# Patient Record
Sex: Female | Born: 1950 | Race: Black or African American | Hispanic: No | Marital: Single | Smoking: Current every day smoker
Health system: Southern US, Community
[De-identification: ages and names within clinical notes are randomized; demographics above are authoritative.]

## PROBLEM LIST (undated history)

## (undated) DIAGNOSIS — S82143A Displaced bicondylar fracture of unspecified tibia, initial encounter for closed fracture: Secondary | ICD-10-CM

## (undated) DIAGNOSIS — M25561 Pain in right knee: Secondary | ICD-10-CM

## (undated) DIAGNOSIS — E785 Hyperlipidemia, unspecified: Secondary | ICD-10-CM

## (undated) DIAGNOSIS — I10 Essential (primary) hypertension: Secondary | ICD-10-CM

## (undated) HISTORY — DX: Essential (primary) hypertension: I10

## (undated) HISTORY — PX: KNEE SURGERY: SHX244

## (undated) HISTORY — DX: Hyperlipidemia, unspecified: E78.5

## (undated) HISTORY — PX: ABDOMINAL HYSTERECTOMY: SHX81

---

## 2010-11-21 ENCOUNTER — Emergency Department (HOSPITAL_COMMUNITY)
Admission: EM | Admit: 2010-11-21 | Discharge: 2010-11-21 | Disposition: A | Discharge status: Home / Self Care | Payer: Managed Care, Other (non HMO) | Attending: Emergency Medicine | Admitting: Emergency Medicine

## 2010-11-21 ENCOUNTER — Emergency Department (HOSPITAL_COMMUNITY): Payer: Managed Care, Other (non HMO)

## 2010-11-21 DIAGNOSIS — S82109A Unspecified fracture of upper end of unspecified tibia, initial encounter for closed fracture: Secondary | ICD-10-CM | POA: Insufficient documentation

## 2010-11-22 ENCOUNTER — Inpatient Hospital Stay (HOSPITAL_COMMUNITY)
Admission: EM | Admit: 2010-11-22 | Discharge: 2010-11-24 | DRG: 563 | Disposition: A | Discharge status: Home Health | Payer: Managed Care, Other (non HMO) | Attending: Orthopedic Surgery | Admitting: Orthopedic Surgery

## 2010-11-22 ENCOUNTER — Inpatient Hospital Stay (HOSPITAL_COMMUNITY): Payer: Managed Care, Other (non HMO)

## 2010-11-22 ENCOUNTER — Emergency Department (HOSPITAL_COMMUNITY): Payer: Managed Care, Other (non HMO)

## 2010-11-22 DIAGNOSIS — F17200 Nicotine dependence, unspecified, uncomplicated: Secondary | ICD-10-CM | POA: Diagnosis present

## 2010-11-22 DIAGNOSIS — S82109A Unspecified fracture of upper end of unspecified tibia, initial encounter for closed fracture: Principal | ICD-10-CM | POA: Diagnosis present

## 2010-11-22 LAB — TYPE AND SCREEN
ABO/RH(D): O NEG
Antibody Screen: NEGATIVE

## 2010-11-22 LAB — COMPREHENSIVE METABOLIC PANEL
ALT: 12 U/L (ref 0–35)
AST: 12 U/L (ref 0–37)
Albumin: 3.8 g/dL (ref 3.5–5.2)
CO2: 25 mEq/L (ref 19–32)
Calcium: 9.2 mg/dL (ref 8.4–10.5)
Chloride: 105 mEq/L (ref 96–112)
GFR calc Af Amer: >60 mL/min (ref >60)
GFR calc non Af Amer: >60 mL/min (ref >60)
Sodium: 139 mEq/L (ref 135–145)
Total Bilirubin: 0.9 mg/dL (ref 0.3–1.2)

## 2010-11-22 LAB — CBC
Hemoglobin: 13.1 g/dL (ref 12.0–15.0)
Platelets: 230 10*3/uL (ref 150–400)
RBC: 4.23 MIL/uL (ref 3.87–5.11)

## 2010-11-22 LAB — DIFFERENTIAL
Basophils Absolute: 0 10*3/uL (ref 0.0–0.1)
Basophils Relative: 0 % (ref 0–1)
Eosinophils Absolute: 0 10*3/uL (ref 0.0–0.7)
Neutro Abs: 7.6 10*3/uL (ref 1.7–7.7)
Neutrophils Relative %: 78 % — ABNORMAL HIGH (ref 43–77)

## 2010-11-23 LAB — URINE CULTURE

## 2010-11-23 LAB — URINE MICROSCOPIC-ADD ON

## 2010-11-23 LAB — URINALYSIS, ROUTINE W REFLEX MICROSCOPIC
Bilirubin Urine: NEGATIVE
Glucose, UA: NEGATIVE mg/dL
Hgb urine dipstick: NEGATIVE
Specific Gravity, Urine: 1.018 (ref 1.005–1.030)
Urobilinogen, UA: 1 mg/dL (ref 0.0–1.0)

## 2010-12-01 ENCOUNTER — Encounter (HOSPITAL_COMMUNITY)
Admission: RE | Admit: 2010-12-01 | Discharge: 2010-12-01 | Disposition: A | Discharge status: Home / Self Care | Payer: Managed Care, Other (non HMO) | Source: Ambulatory Visit | Attending: Orthopedic Surgery | Admitting: Orthopedic Surgery

## 2010-12-01 LAB — CBC
HCT: 38.6 % (ref 36.0–46.0)
Hemoglobin: 13.1 g/dL (ref 12.0–15.0)
MCH: 29.9 pg (ref 26.0–34.0)
MCHC: 33.9 g/dL (ref 30.0–36.0)
MCV: 88.1 fL (ref 78.0–100.0)
RBC: 4.38 MIL/uL (ref 3.87–5.11)

## 2010-12-01 LAB — TYPE AND SCREEN: ABO/RH(D): O NEG

## 2010-12-01 LAB — SURGICAL PCR SCREEN
MRSA, PCR: NEGATIVE
Staphylococcus aureus: NEGATIVE

## 2010-12-02 ENCOUNTER — Inpatient Hospital Stay (HOSPITAL_COMMUNITY)
Admission: RE | Admit: 2010-12-02 | Discharge: 2010-12-03 | DRG: 494 | Disposition: A | Discharge status: Home Health | Payer: Managed Care, Other (non HMO) | Source: Ambulatory Visit | Attending: Orthopedic Surgery | Admitting: Orthopedic Surgery

## 2010-12-02 ENCOUNTER — Inpatient Hospital Stay (HOSPITAL_COMMUNITY): Payer: Managed Care, Other (non HMO)

## 2010-12-02 DIAGNOSIS — F17200 Nicotine dependence, unspecified, uncomplicated: Secondary | ICD-10-CM | POA: Diagnosis present

## 2010-12-02 DIAGNOSIS — S82109A Unspecified fracture of upper end of unspecified tibia, initial encounter for closed fracture: Principal | ICD-10-CM | POA: Diagnosis present

## 2010-12-02 DIAGNOSIS — Z01812 Encounter for preprocedural laboratory examination: Secondary | ICD-10-CM

## 2010-12-03 ENCOUNTER — Inpatient Hospital Stay (HOSPITAL_COMMUNITY): Payer: Managed Care, Other (non HMO)
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
## 2011-02-09 ENCOUNTER — Encounter (HOSPITAL_COMMUNITY): Payer: Self-pay | Admitting: Physical Therapy

## 2011-02-09 ENCOUNTER — Ambulatory Visit (HOSPITAL_COMMUNITY)
Admission: RE | Admit: 2011-02-09 | Discharge: 2011-02-09 | Disposition: A | Discharge status: Home / Self Care | Payer: Managed Care, Other (non HMO) | Source: Ambulatory Visit | Attending: Orthopedic Surgery | Admitting: Orthopedic Surgery

## 2011-02-09 DIAGNOSIS — R262 Difficulty in walking, not elsewhere classified: Secondary | ICD-10-CM | POA: Insufficient documentation

## 2011-02-09 DIAGNOSIS — Z8781 Personal history of (healed) traumatic fracture: Secondary | ICD-10-CM | POA: Insufficient documentation

## 2011-02-09 DIAGNOSIS — S82143A Displaced bicondylar fracture of unspecified tibia, initial encounter for closed fracture: Secondary | ICD-10-CM | POA: Insufficient documentation

## 2011-02-09 DIAGNOSIS — M25569 Pain in unspecified knee: Secondary | ICD-10-CM | POA: Insufficient documentation

## 2011-02-09 DIAGNOSIS — M6281 Muscle weakness (generalized): Secondary | ICD-10-CM | POA: Insufficient documentation

## 2011-02-09 DIAGNOSIS — IMO0001 Reserved for inherently not codable concepts without codable children: Secondary | ICD-10-CM | POA: Insufficient documentation

## 2011-02-09 DIAGNOSIS — M25669 Stiffness of unspecified knee, not elsewhere classified: Secondary | ICD-10-CM | POA: Insufficient documentation

## 2011-02-09 DIAGNOSIS — M25659 Stiffness of unspecified hip, not elsewhere classified: Secondary | ICD-10-CM | POA: Insufficient documentation

## 2011-02-09 HISTORY — DX: Displaced bicondylar fracture of unspecified tibia, initial encounter for closed fracture: S82.143A

## 2011-02-09 HISTORY — DX: Pain in right knee: M25.561
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
## 2011-02-09 NOTE — Patient Instructions (Addendum)
Initial evaluation, therapeutic exercise and education in HEP. Pt agreeable to treatment plan.  

## 2011-02-14 ENCOUNTER — Inpatient Hospital Stay (HOSPITAL_COMMUNITY)
Admission: RE | Admit: 2011-02-14 | Discharge: 2011-02-14 | Payer: Managed Care, Other (non HMO) | Source: Ambulatory Visit

## 2011-02-16 ENCOUNTER — Ambulatory Visit (HOSPITAL_COMMUNITY)
Admission: RE | Admit: 2011-02-16 | Discharge: 2011-02-16 | Disposition: A | Discharge status: Home / Self Care | Payer: Managed Care, Other (non HMO) | Source: Ambulatory Visit | Attending: Orthopedic Surgery | Admitting: Orthopedic Surgery

## 2011-02-16 NOTE — Progress Notes (Signed)
Physical Therapy Treatment Patient Name: Jennifer Armstrong Date: 02/16/2011  Time In: 2:52 Time Out: 3:30  Visit #: 2/2 Next Re-eval: 03/12/2011 Charge: therex 32 min  Subjective: Symptoms/Limitations Symptoms: No pain today.  Objective:  L flat foot gait, vc/demonstration for heel-->gait required  Exercise/Treatments Bike warm up 6' @ 3.0; seat 8 STANDING: Functional squats Heel raise 10 reps Toe raise 10 reps Step up 4" 10x Lateral step up 4" 10x demonstration, vc, manual assistance for technique Step down 4" 10x  TKE 5x 5" SUPINE  Active Hamstring Stretch 3x30 sec  SLR x10  Quad Sets 10x 5sec - manual cueing for sequencing  ITB stretch 3x30 sec  PRONE:  Hang x2 min 0#    Lumbar Stretches Active Hamstring Stretch: 3 reps;30 seconds ITB Stretch: 3 reps;30 seconds Lumbar Machine Exercises Stationary Bike: 6' @ 2.0 Hip Stretches Active Hamstring Stretch: 3 reps;30 seconds Hip Exercises Heel Slides: 10 reps Straight Leg Raises: 10 reps;Supine;Right Additional Hip Exercises Lateral Step Up: Step Height: 4";10 reps Forward Step Up: Step Height: 4";10 reps Stationary Bike: 6' @ 2.0 Knee Stretches Active Hamstring Stretch: 3 reps;30 seconds ITB Stretch: 3 reps;30 seconds Knee Exercises Quad Sets: 10 reps;Supine (10x 5") Knee Extension: 5 reps;Standing (TKE 5x 5") Straight Leg Raises: 10 reps;Supine;Right Heel Slides: 10 reps Prone Knee Hang: 2 minutes (0 #) Additional Knee Exercises Lateral Step Up: Step Height: 4";10 reps Forward Step Up: Step Height: 4";10 reps Functional Squat: 10 seconds Ankle Exercises Toe Raise: 10 reps Balance Exercises Stationary Bike: 6' @ 2.0 Toe Raise: 10 reps    Goals   End of Session Patient Active Problem List  Diagnoses  . Tibial plateau fracture  . S/p tibial fracture  . Stiffness of joint, not elsewhere classified, lower leg   PT - End of Session Activity Tolerance: Patient tolerated treatment  well General Behavior During Session: [facility] for tasks performed Cognition: [facility] for tasks performed PT Assessment and Plan Clinical Impression Statement: Pt with vc/demonstration for heel --> toe gait.  Pt required mulltimodal cueing with steps for proper position/technique.  No increased pain with any therex. PT Plan: Continue with current POC with appropriate gait, balance, stretches and strengthening.  Juel Burrow 02/16/2011, 4:09 PM

## 2011-02-18 ENCOUNTER — Ambulatory Visit (HOSPITAL_COMMUNITY)
Admission: RE | Admit: 2011-02-18 | Discharge: 2011-02-18 | Disposition: A | Discharge status: Home / Self Care | Payer: Managed Care, Other (non HMO) | Source: Ambulatory Visit | Attending: Physical Therapy | Admitting: Physical Therapy

## 2011-02-18 NOTE — Progress Notes (Addendum)
Physical Therapy Treatment Patient Name: DRAVEN LAINE IONGE'X Date: 02/18/2011 Time In: 331  Time Out: 412 Visit #: 3/3 Initial Eval: 02/09/11 Next Re-eval: 03/09/2011  Charge: 41 min TE HPI: Symptoms/Limitations Symptoms: "I am doing fine from the last treatment." Pt reports her c/co is antalgic gait.  Pt ambulates into therapy with anatalgic gait w/signifcant decrease in knee flexion and gluteal medius weakness. AROM: 0-126 degrees  Exercise/Treatments Bike warm up 6' @ 4.0; seat 8  STANDING:  Functional squats 10x  Knee flexion 10x5 sec hold 2 # Heel raise 2x10 reps  Toe raise 2x10 reps  Step up 4" 2x10  Lateral step up 4" 2x10 Step down 4" 2x10  -tactile and verbal cueing for sequencing TKE 5x 10"  Heel Toe Walk 3 RT SUPINE  Active Hamstring Stretch 3x30 sec   ITB stretch 3x30 sec   Goals PT Short Term Goals Short Term Goal 1: 1.Pt will be Independent in HEP in order to maximize therapeutic effect. Short Term Goal 1 Progress: Progressing toward goal Short Term Goal 2: 2.Pt will demonstrate RLE SLS x10 sec.  Short Term Goal 2 Progress: Progressing toward goal Short Term Goal 3: 3.Pt will improve AROM to 0-115 degrees Short Term Goal 3 Progress: Met Short Term Goal 4: LTG #1: 1.Pt will improve knee AROM to [facility] in order to ambulate with appropriate gait mechanics. Short Term Goal 4 Progress: Not met PT Long Term Goals Long Term Goal 1: 2.Pt will increase LE strength to [facility] in order to ambulate and stand for greater than an hour to participate in work activities Long Term Goal 1 Progress: Not met Long Term Goal 2: 3.Pt will report pain less than or equal to 1/10 for 75% of her day for improved quality of life. Long Term Goal 2 Progress: Progressing toward goal Long Term Goal 3: 4.Pt will improve her LEFS score by 9 points for improved perceived functional ability.  Long Term Goal 3 Progress: Not met Long Term Goal 4: 5.Pt will demonstrate tandem walking x100 ft on  uneven surface for improved safety with outdoor activities.  Long Term Goal 4 Progress: Not met End of Session Patient Active Problem List  Diagnoses  . Tibial plateau fracture  . S/p tibial fracture  . Stiffness of joint, not elsewhere classified, lower leg   PT - End of Session Activity Tolerance: Patient tolerated treatment well PT Assessment and Plan Clinical Impression Statement: Pt continues to require multimodal cueing for proper gait sequencing especially for appropriate heel strike and knee flexion.  Overall pt functional strength is improving.  PT Plan: Cont to progress. Add stairwell, single leg stance, tandem walk, retro walk  Quinley Nesler 02/18/2011, 4:20 PM

## 2011-02-21 ENCOUNTER — Ambulatory Visit (HOSPITAL_COMMUNITY)
Admission: RE | Admit: 2011-02-21 | Discharge: 2011-02-21 | Disposition: A | Discharge status: Home / Self Care | Payer: Managed Care, Other (non HMO) | Source: Ambulatory Visit | Attending: Orthopedic Surgery | Admitting: Orthopedic Surgery

## 2011-02-21 NOTE — Progress Notes (Addendum)
  Patient Name: Jennifer Armstrong  [id number] Date: 02/21/2011   Time In: 3:33  Time Out: 4:07  Visit #: 4/4 (4 of 12 total) Initial Eval: 02/09/11  Next Re-eval: 03/09/2011  Charge: 30 min TE   SUBJECTIVE:   Pt reports she is currently painfree.  States she has a MD appt. 8/16 and hopes to return to work.   Exercise/Treatments  Treadmill to work on normalizing gait:  1.  X 5 minutes. Verbal cues to increase and equalize step length.  STANDING:  Functional squats 15x  Heel raise 20 reps  Toe raise 20reps  Hip Abduction, Bilaterally 10X Step up 6" 10X Lateral step up 6" 10X Step down forward 6" 10X  Heelwalk/  Toewalk   2 RT Retro Ambulation 2RT Tandem Gait 2RT Vector Stance 5X5" each SLS  Best of 3, 21 seconds      Goals  PT Short Term Goals  Short Term Goal 1: 1.Pt will be Independent in HEP in order to maximize therapeutic effect.  Short Term Goal 1 Progress: Progressing toward goal  Short Term Goal 2: 2.Pt will demonstrate RLE SLS x10 sec.  Short Term Goal 2 Progress: MET Short Term Goal 3: 3.Pt will improve AROM to 0-115 degrees  Short Term Goal 3 Progress: Met  Short Term Goal 4: LTG #1: 1.Pt will improve knee AROM to [facility] in order to ambulate with appropriate gait mechanics.  Short Term Goal 4 Progress: Not met  PT Long Term Goals  Long Term Goal 1: 2.Pt will increase LE strength to [facility] in order to ambulate and stand for greater than an hour to participate in work activities  Long Term Goal 1 Progress: Not met  Long Term Goal 2: 3.Pt will report pain less than or equal to 1/10 for 75% of her day for improved quality of life.  Long Term Goal 2 Progress: Progressing toward goal  Long Term Goal 3: 4.Pt will improve her LEFS score by 9 points for improved perceived functional ability.  Long Term Goal 3 Progress: Not met  Long Term Goal 4: 5.Pt will demonstrate tandem walking x100 ft on uneven surface for improved safety with outdoor activities.  Long Term Goal 4  Progress: Not met   End of Session    Patient Active Problem List    Diagnoses    .  Tibial plateau fracture    .  S/p tibial fracture    .  Stiffness of joint, not elsewhere classified, lower leg     PT - End of Session   Activity Tolerance: Patient tolerated treatment well  PT Assessment and Plan  Clinical Impression Statement: Pt continues to require multimodal cueing for proper gait sequencing especially for appropriate heel strike and knee flexion. Overall pt functional strength is improving.    PT Plan: Cont to progress. Add Stairwell and balance beam next visit.  Emeline Gins, PTA 02/21/2011, 4:40 PM

## 2011-02-23 ENCOUNTER — Ambulatory Visit (HOSPITAL_COMMUNITY)
Admission: RE | Admit: 2011-02-23 | Discharge: 2011-02-23 | Disposition: A | Discharge status: Home / Self Care | Payer: Managed Care, Other (non HMO) | Source: Ambulatory Visit | Attending: Orthopedic Surgery | Admitting: Orthopedic Surgery

## 2011-02-23 DIAGNOSIS — M25659 Stiffness of unspecified hip, not elsewhere classified: Secondary | ICD-10-CM | POA: Insufficient documentation

## 2011-02-23 DIAGNOSIS — M6281 Muscle weakness (generalized): Secondary | ICD-10-CM | POA: Insufficient documentation

## 2011-02-23 DIAGNOSIS — IMO0001 Reserved for inherently not codable concepts without codable children: Secondary | ICD-10-CM | POA: Insufficient documentation

## 2011-02-23 DIAGNOSIS — R262 Difficulty in walking, not elsewhere classified: Secondary | ICD-10-CM | POA: Insufficient documentation

## 2011-02-23 DIAGNOSIS — M25569 Pain in unspecified knee: Secondary | ICD-10-CM | POA: Insufficient documentation

## 2011-02-23 DIAGNOSIS — M25669 Stiffness of unspecified knee, not elsewhere classified: Secondary | ICD-10-CM | POA: Insufficient documentation

## 2011-02-23 NOTE — Progress Notes (Signed)
Physical Therapy Treatment Patient Name: Jennifer Armstrong Date: 02/23/2011  Time In: 1:04 Time Out: 1:45 Visit #: 5 of 12 Next Re-eval: 03/09/2011 Charge: therex 30 min Neuro re-ed 11 min  Subjective: Symptoms/Limitations Symptoms: pain free today. Pain Assessment Currently in Pain?: No/denies Multiple Pain Sites: No  Objective:  AROM 2-122 degrees  Exercise/Treatments Treadmill to work on normalizing gait: 1. X 5 minutes. Verbal cues to increase and equalize step length.  Functional squats 15x  Heel raise 20 reps  Toe raise 20reps  Hip Abduction, Bilaterally 10X  Step up 6" 15X  Lateral step up 6" 15X  Step down forward 6" 15X  Heelwalk/ Toewalk 2 RT  Retro Ambulation 2RT  Tandem Gait 2RT  Vector Stance 10X5" each  SLS Best of 3, 33 seconds Balance beam 1 RT tandem  Stairwell 1 RT  Stability Exercises Heel Raises: 20 reps Lumbar Machine Exercises Tread Mill: 1.37mphX5 mins Additional Hip Exercises SLS: R max of 33" Lateral Step Up: Step Height: 6";15 reps Forward Step Up: Step Height: 6";15 reps Stairs: 1 RT reciprocal Tread Mill: 1.16mphX5 mins Knee Exercises Heel Raises: 20 reps Hip ABduction: 15 reps;Right Additional Knee Exercises Lateral Step Up: Step Height: 6";15 reps Forward Step Up: Step Height: 6";15 reps Functional Squat: 15 reps Stairs: 1 RT reciprocal SLS: R max of 33" SLS with Vectors: 10X5"R only Ankle Exercises Heel Raises: 20 reps Toe Raise: 20 reps Additional Ankle Exercises SLS: R max of 33" Heel Walk (Round Trip): 2RT Toe Walk (Round Trip): 2RT Balance Exercises Tread Mill: 1.97mphX5 mins Tandem Walking: 1 round trip Retro Gait: 2 round trips Stairs: 1 RT reciprocal Heel Raises: 20 reps Toe Raise: 20 reps Balance Beam: 1 RT    Goals PT Short Term Goals Short Term Goal 3 Progress: Met Short Term Goal 4 Progress: Met End of Session Patient Active Problem List  Diagnoses  . Tibial plateau fracture  . S/p tibial  fracture  . Stiffness of joint, not elsewhere classified, lower leg   PT - End of Session Activity Tolerance: Patient tolerated treatment well General Behavior During Session: [facility] for tasks performed Cognition: [facility] for tasks performed PT Assessment and Plan Clinical Impression Statement: Pt with verbal/tactile cues for position/stability.  Began stairwell with vc for proper sequence with 1 handrail assistance.  Tandem gait completed on tile and balance beam with 2 LOB, pt able to independently regain balance without assistance. PT Plan: Continue with current POC, resume quad and hamstring cybex next tx.  Juel Burrow 02/23/2011, 1:58 PM

## 2011-02-25 ENCOUNTER — Ambulatory Visit (HOSPITAL_COMMUNITY)
Admission: RE | Admit: 2011-02-25 | Discharge: 2011-02-25 | Disposition: A | Discharge status: Home / Self Care | Payer: Managed Care, Other (non HMO) | Source: Ambulatory Visit

## 2011-02-25 NOTE — Progress Notes (Signed)
Physical Therapy Treatment Patient Name: Jennifer Armstrong Date: 02/25/2011  Time In: 1:02 Time Out: 1:54 Visit #: 6 out of 12 Next Re-eval: 03/09/2011 Charge: therex x 28 min Neuro reed 20 min  Subjective: Symptoms/Limitations Symptoms: pain free today  Objective:  Exercise/Treatments Treadmill to work on normalizing gait: 1. X 5 minutes. Verbal cues for posture  Functional squats 15x  Heel raise 20 reps  Toe raise 20reps  Hip Abduction, Bilaterally 15X vc/tactile for posture and reduce ER  Hip extension 15x Step up 6" 15X  Lateral step up 6" 15X  Step down forward 6" 15X  Heelwalk/ Toewalk 2 RT  Tandem Gait 2RT  Vector Stance 10X5" each  SLS Best of 3, 25 seconds  Balance beam 1 RT tandem  Stairwell 1 RT Cybex quad 3Pl 10reps x 2 Cybex Hs 5 pl 10 reps x 2    Goals   End of Session Patient Active Problem List  Diagnoses  . Tibial plateau fracture  . S/p tibial fracture  . Stiffness of joint, not elsewhere classified, lower leg   PT - End of Session Activity Tolerance: Patient tolerated treatment well General Behavior During Session: [facility] for tasks performed Cognition: [facility] for tasks performed PT Assessment and Plan Clinical Impression Statement: Pt with verbal/tactile cues for posture, position and stability with all therex.  Weak quad eccentric control shown descending steps, pt with proper sequence with 1 handrail for reciprocal gait with step. PT Plan: Progress strength and balance, re-eval due 03/09/2011.  Juel Burrow 02/25/2011, 4:04 PM

## 2011-03-02 ENCOUNTER — Ambulatory Visit (HOSPITAL_COMMUNITY)
Admission: RE | Admit: 2011-03-02 | Discharge: 2011-03-02 | Disposition: A | Discharge status: Home / Self Care | Payer: Managed Care, Other (non HMO) | Source: Ambulatory Visit | Attending: Orthopedic Surgery | Admitting: Orthopedic Surgery

## 2011-03-02 NOTE — Progress Notes (Signed)
Physical Therapy Treatment Patient Name: Jennifer Armstrong [id number] Date: 03/02/2011 Time In:  1523 Time Out: 1603 Visit #: 7 out of 12  Next Re-eval: 03/09/2011 - RE-EVAL NEXT TREATMENT Charge: therex x 15 min  Neuro reed 20 min HPI: Symptoms/Limitations Symptoms: Pt reports no pain today.  She repots that she still has increased knee stiffness from going from sit to stand.  Pain Assessment Multiple Pain Sites: No  Exercise/Treatments Treadmill to work on normalizing gait: 1. X 5 minutes. Verbal cues for posture  STANDING Wall Sits 10x10 sec hold  Step up 6" 2X10 Lateral step up 6" 15X  Step down forward 6" 2x10 TC's to decrease pelvic rotatio  Heelwalk/ Toewalk 3 RT  Tandem Gait 2RT  Vector Stance 10X5" each - VC's and TC's for proper posture SLS Best of 3x30 sec.   Cybex quad w/ecc lower 3Pl 10reps x 2  Cybex Hs 5.5 pl 10 reps x 2  Hip Hikes 15x w/multimodal cueing for posture and glute med activation.    Goals PT Short Term Goals Short Term Goal 1: 1.Pt will be Independent in HEP in order to maximize therapeutic effect. Short Term Goal 1 Progress: Met Short Term Goal 2: 2.Pt will demonstrate RLE SLS x10 sec.  Short Term Goal 2 Progress: Met Short Term Goal 3: 3.Pt will improve AROM to 0-115 degrees Short Term Goal 3 Progress: Met Short Term Goal 4: LTG #1: 1.Pt will improve knee AROM to [facility] in order to ambulate with appropriate gait mechanics. Short Term Goal 4 Progress: Progressing toward goal PT Long Term Goals Long Term Goal 1: 2.Pt will increase LE strength to [facility] in order to ambulate and stand for greater than an hour to participate in work activities Long Term Goal 1 Progress: Met Long Term Goal 2: 3.Pt will report pain less than or equal to 1/10 for 75% of her day for improved quality of life. Long Term Goal 2 Progress: Met Long Term Goal 3: 4.Pt will improve her LEFS score by 9 points for improved perceived functional ability.  Long Term Goal 3 Progress:  Progressing toward goal Long Term Goal 4: 5.Pt will demonstrate tandem walking x100 ft on uneven surface for improved safety with outdoor activities.  Long Term Goal 4 Progress: Progressing toward goal End of Session Patient Active Problem List  Diagnoses  . Tibial plateau fracture  . S/p tibial fracture  . Stiffness of joint, not elsewhere classified, lower leg   PT - End of Session Activity Tolerance: Patient tolerated treatment well PT Assessment and Plan Clinical Impression Statement: Pt continues to be most limited by balance and postural awareness/weakness noted with increased lumbar flexion with balance activities.  Pt has made signifincant gains towards pain managment.   PT Plan: RE-eval next treatment for MD apt on 8/15.  Cont to progress  Jennifer Armstrong 03/02/2011, 4:14 PM

## 2011-03-04 ENCOUNTER — Ambulatory Visit (HOSPITAL_COMMUNITY)
Admission: RE | Admit: 2011-03-04 | Discharge: 2011-03-04 | Disposition: A | Discharge status: Home / Self Care | Payer: Managed Care, Other (non HMO) | Source: Ambulatory Visit | Attending: Orthopedic Surgery | Admitting: Orthopedic Surgery

## 2011-03-04 NOTE — Progress Notes (Addendum)
  Patient Name: Jennifer Armstrong MRN: [id number] Today's Date: 03/04/2011 Time: 326-400 Charges: 1 ROM, 1 MMT, 8 min Gt training, 12 min TE 8 min TM @ 1.3 -cueing to decrease antalgic gait PRONE: Knee flexion 2x10 SLR 2x10 Sidelying: R hip Abduction 2x10 H/S stretch 3x30 sec.  ITB Stretch 3x30 sec STANDING: Tandem Gait on Grass x2 minutes REVIEWED HEP.  Physical Therapy D/C Summary  Diagnosis: R tibial ORIF s/p bicondyular tibial plateau fracture ICD-9 Code: 719.7, 719.56 Referring practitioner: Carola Frost Date of next MD visit: 03/09/11 Date of initial PT Visit: 02/06/11 Patient seen for 8 sessions  Subjective:    Patient's response to therapy: Pt reports that she is independent with her HEP.  She continues to have limited pain if any.  Pt reports she believes she could go back to work full time.    Objective:   Current condition: Ambulates independently.  Reports being able to stand for 45-60 minutes, walk: 60 minutes on a dirt road.  Independent with driving.  Lower Extremity Functional Scale: 80/80 Condition During Eval:  Ambulates with knee brace.  Unable to drive.  Stands 20 minutes, walk 15 minutes, sit unlimited.  Lower Extremity functional scale: 47/80  Test measurement:   MMT AROM PROM  Hip Flexion 5/5     (5/5)    Gluteus Medius  5/5    (4/5)    Gluteus Maximus 4/5     (3/5)    Adductor 5/5    (3+/5)    Knee Extension 5/5     (4/5)    0  (10)   0  (3)  Knee Flexion 5/5     (4/5)    112  (108)   120  (120)  ( ) = initial evaluation on 02/09/11     Assessment:   Summary/analysis of evaluation:Ms Heagle was referred to outpatient PT s/p R tibial ORIF after bicondyular tibial plateau fracture .  After 4 weeks of therapy she has made significant progress with impairements including decreased pain, improved RLE strength, R knee AROM and PROM and improved her overall balance and perceived functional ability.   Pt continues to have some limitation with mild antalgic  gait.   Plan:   Goals SHORT TERM GOALS: to be met in 2 weeks. Patient will be able to: 1. Pt will be Independent in HEP in order to maximize therapeutic effect. - MET 2. Pt will demonstrate RLE SLS x10 sec. - MET 3. Pt will improve AROM to 0-115 degrees - Partly Met, gained to 112 degrees LONG TERM GOALS: to be met in 4 weeks. Patient will be able to: 1. Pt will improve knee AROM to [facility] in order to ambulate with appropriate gait mechanics. - Partly Met, continues to have mild antalgic gait 2. Pt will increase LE strength to [facility] in order to ambulate and stand for greater than an hour to participate in work activities -MET 3. Pt will report pain less than or equal to 1/10 for 75% of her day for improved quality of life. - MET 4. Pt will improve her LEFS score by 9 points for improved perceived functional ability. - 5. Pt will demonstrate tandem walking x100 ft on uneven surface for improved safety with outdoor activities. - MET   Plan Recommendations:D/C w/HEP upon MD recommendation.     Jaise Moser 03/04/2011, 4:09 PM

## 2011-03-07 ENCOUNTER — Ambulatory Visit (HOSPITAL_COMMUNITY): Payer: Managed Care, Other (non HMO)

## 2011-03-09 ENCOUNTER — Ambulatory Visit (HOSPITAL_COMMUNITY): Payer: Managed Care, Other (non HMO) | Admitting: Physical Therapy

## 2011-03-11 ENCOUNTER — Ambulatory Visit (HOSPITAL_COMMUNITY): Payer: Managed Care, Other (non HMO) | Admitting: *Deleted

## 2011-03-14 ENCOUNTER — Ambulatory Visit (HOSPITAL_COMMUNITY): Payer: Managed Care, Other (non HMO) | Admitting: Physical Therapy

## 2011-03-14 ENCOUNTER — Inpatient Hospital Stay (HOSPITAL_COMMUNITY)
Admission: RE | Admit: 2011-03-14 | Payer: Managed Care, Other (non HMO) | Source: Ambulatory Visit | Admitting: *Deleted

## 2011-03-16 ENCOUNTER — Ambulatory Visit (HOSPITAL_COMMUNITY): Payer: Managed Care, Other (non HMO) | Admitting: *Deleted

## 2011-03-18 ENCOUNTER — Ambulatory Visit (HOSPITAL_COMMUNITY): Payer: Managed Care, Other (non HMO)

## 2012-02-28 IMAGING — RF DG KNEE 1-2V*R*
1 series · 4 of 4 positions shown · non-contrast
Comparison: 11/22/2010

CLINICAL DATA: Knee pain

RIGHT KNEE - 1-2 VIEW

[Series 1: run · 4 of 4 slices shown]
[im 1/4]
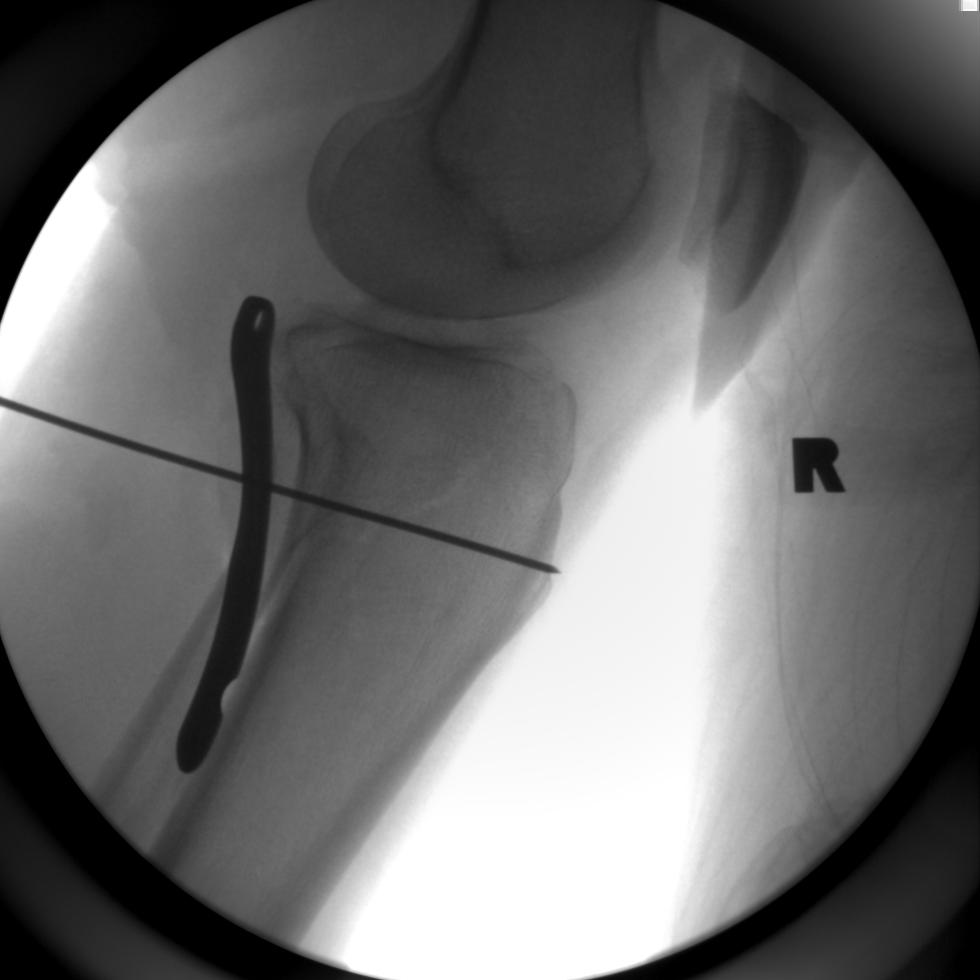
[im 2/4]
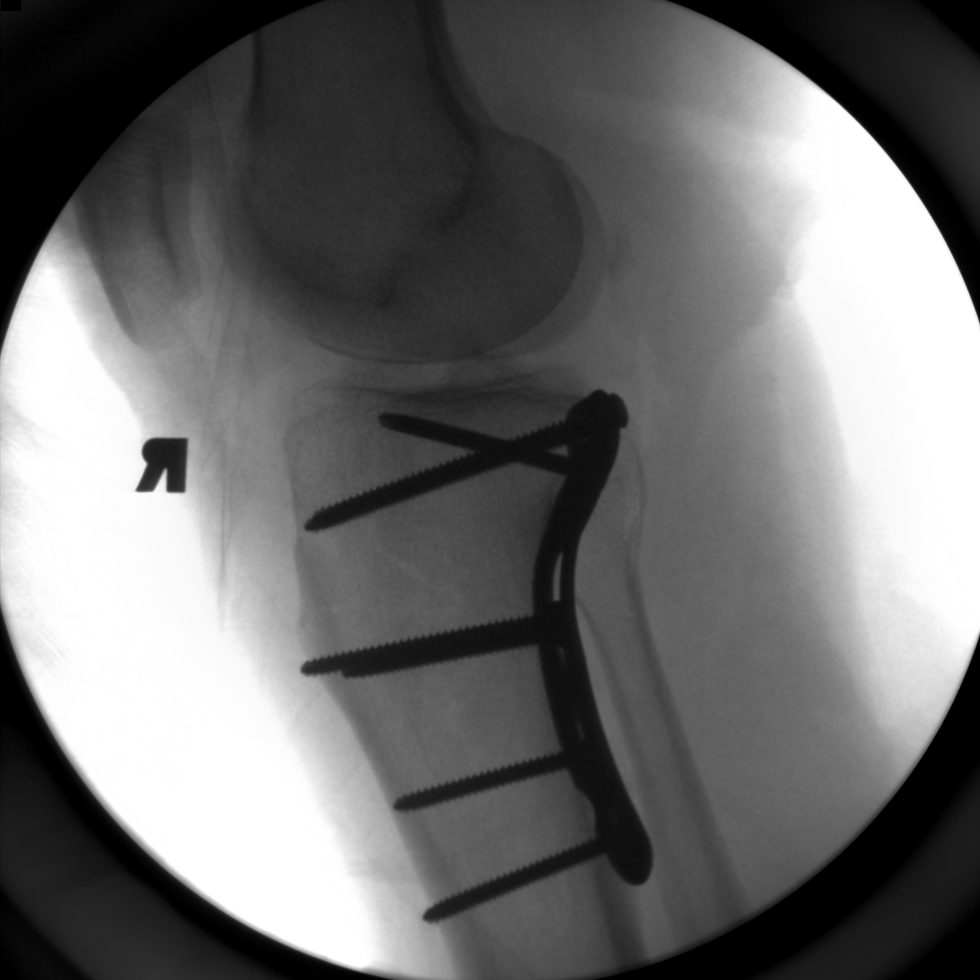
[im 3/4]
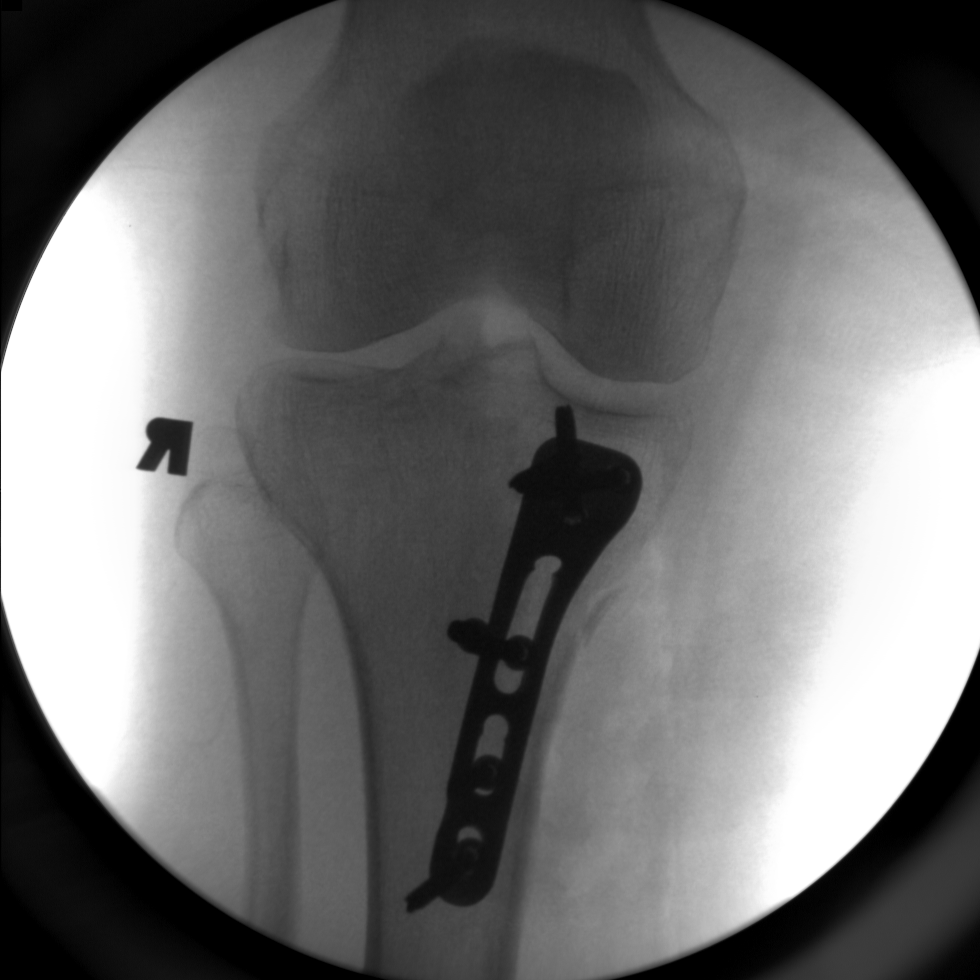
[im 4/4]
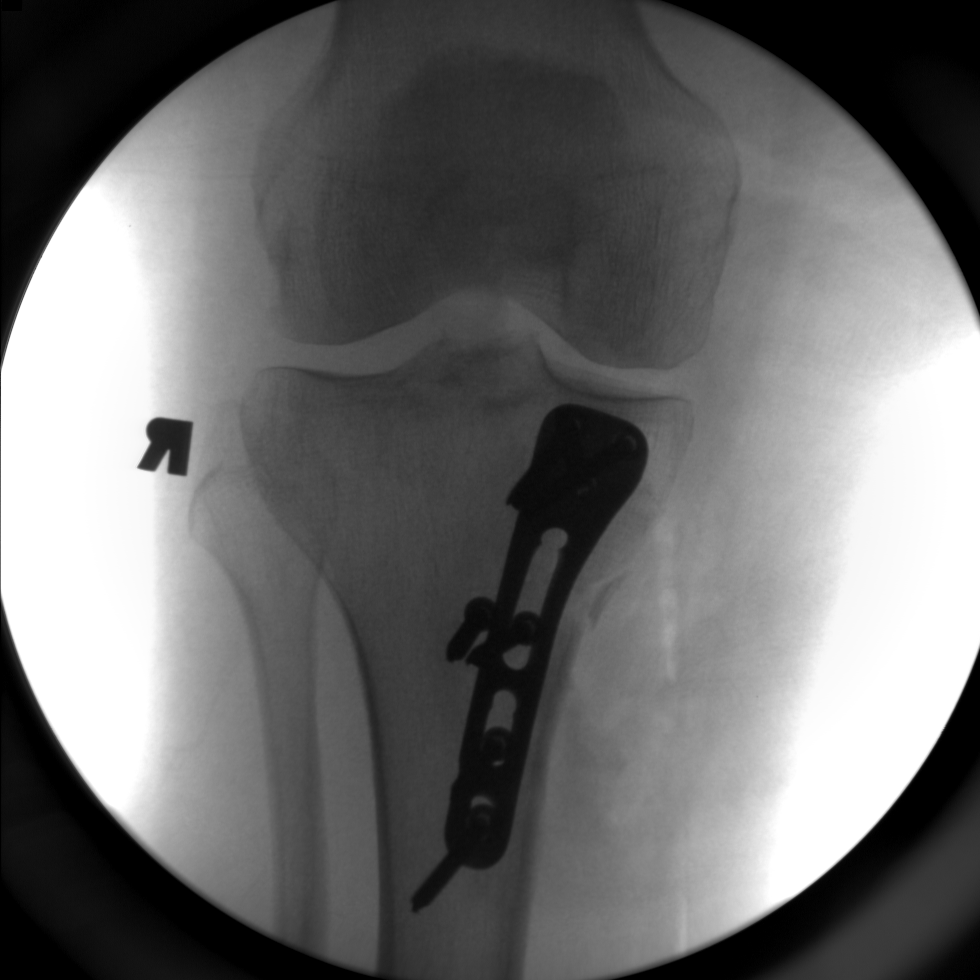

[4 of 4 positions shown; findings below may reference images not displayed]

FINDINGS: Multiple C-arm films document placement of a plate and
screw fixation across a medial tibial plateau fracture.
IMPRESSION: As above.

## 2017-10-23 ENCOUNTER — Other Ambulatory Visit: Payer: Self-pay

## 2017-10-23 ENCOUNTER — Emergency Department (HOSPITAL_COMMUNITY): Payer: Self-pay

## 2017-10-23 ENCOUNTER — Encounter (HOSPITAL_COMMUNITY): Payer: Self-pay | Admitting: Emergency Medicine

## 2017-10-23 ENCOUNTER — Emergency Department (HOSPITAL_COMMUNITY)
Admission: EM | Admit: 2017-10-23 | Discharge: 2017-10-23 | Disposition: A | Discharge status: Home / Self Care | Payer: Self-pay | Attending: Emergency Medicine | Admitting: Emergency Medicine

## 2017-10-23 DIAGNOSIS — Z79899 Other long term (current) drug therapy: Secondary | ICD-10-CM | POA: Insufficient documentation

## 2017-10-23 DIAGNOSIS — M5136 Other intervertebral disc degeneration, lumbar region: Secondary | ICD-10-CM | POA: Insufficient documentation

## 2017-10-23 DIAGNOSIS — M4856XA Collapsed vertebra, not elsewhere classified, lumbar region, initial encounter for fracture: Secondary | ICD-10-CM | POA: Insufficient documentation

## 2017-10-23 DIAGNOSIS — M5432 Sciatica, left side: Secondary | ICD-10-CM | POA: Insufficient documentation

## 2017-10-23 DIAGNOSIS — F17200 Nicotine dependence, unspecified, uncomplicated: Secondary | ICD-10-CM | POA: Insufficient documentation

## 2017-10-23 MED ORDER — TRAMADOL HCL 50 MG PO TABS
50.0000 mg | ORAL_TABLET | Freq: Once | ORAL | Status: AC
  Administered 2017-10-23: 50 mg via ORAL

## 2017-10-23 MED ORDER — NAPROXEN 500 MG PO TABS: 500.0000 mg | ORAL_TABLET | Freq: Two times a day (BID) | ORAL | 0 refills | Status: DC

## 2017-10-23 MED ORDER — TRAMADOL HCL 50 MG PO TABS: 50.0000 mg | ORAL_TABLET | Freq: Four times a day (QID) | ORAL | 0 refills | Status: DC | PRN

## 2017-10-23 MED ORDER — TRAMADOL HCL 50 MG PO TABS
ORAL_TABLET | ORAL | Status: AC
  Administered 2017-10-23: 50 mg via ORAL
  Filled 2017-10-23: qty 1

## 2017-10-23 MED ORDER — KETOROLAC TROMETHAMINE 30 MG/ML IJ SOLN
30.0000 mg | Freq: Once | INTRAMUSCULAR | Status: AC
  Administered 2017-10-23: 30 mg via INTRAMUSCULAR
  Filled 2017-10-23: qty 1

## 2017-10-23 NOTE — ED Notes (Signed)
Denies gu sx

## 2017-10-23 NOTE — ED Triage Notes (Signed)
Left lower back pain radiating into hip and down left leg x 1 week. Denies injury. Has taken tylenol with little relief.

## 2017-10-23 NOTE — Discharge Instructions (Addendum)
As discussed, it is unclear if your vertebral fracture your 3rd lumbar vertebra) is a new or an old injury.   Take the medicines as directed.  Do not drive within 4 hours of taking tramadol as this can make you drowsy.  Avoid lifting,  Bending,  Twisting or any other activity that worsens your pain over the next week.  Apply a heating for 20 minutes several times daily to your back.  You should get rechecked if your symptoms are not better over the next 5 days,  Or you develop increased pain,  Weakness in your leg(s) or loss of bladder or bowel function - these are symptoms of a worsening problem.

## 2017-10-23 NOTE — ED Provider Notes (Signed)
[facility]GENCY DEPARTMENT Provider Note   CSN: [id number] Arrival date & time: 10/23/17  1709     History   Chief Complaint Chief Complaint  Patient presents with  . Back Pain    HPI Jennifer Armstrong is a 67 y.o. female.  The history is provided by the patient.  Back Pain   This is a new problem. Episode onset: 1 week. The problem occurs constantly. 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 The pain is present in the lumbar spine. The quality of the pain is described as aching. The pain radiates to the left thigh. The pain is at a severity of 10/10. The pain is severe. The symptoms are aggravated by bending, twisting and certain positions. The pain is worse during the day. Pertinent negatives include no chest pain, no fever, no numbness, no abdominal pain, no bowel incontinence, no perianal numbness, no bladder incontinence, no dysuria, no paresthesias, no tingling and no weakness. She has tried NSAIDs (tylenol) for the symptoms. The treatment provided no relief.    Past Medical History:  Diagnosis Date  . Knee pain, right   . Tibial plateau fracture     Patient Active Problem List   Diagnosis Date Noted  . Tibial plateau fracture 02/09/2011  . S/p tibial fracture 02/09/2011    Past Surgical History:  Procedure Laterality Date  . ABDOMINAL HYSTERECTOMY    . KNEE SURGERY       OB History   None      Home Medications    Prior to Admission medications   Medication Sig Start Date End Date Taking? Authorizing Provider  naproxen (NAPROSYN) 500 MG tablet Take 1 tablet (500 mg total) by mouth 2 (two) times daily. 10/23/17   Burgess AmorIdol, Alazia Crocket, PA-C  traMADol (ULTRAM) 50 MG tablet Take 1 tablet (50 mg total) by mouth every 6 (six) hours as needed. 10/23/17   Burgess AmorIdol, Jeania Nater, PA-C    Family History History reviewed. No pertinent family  history.  Social History Social History   Tobacco Use  . Smoking status: Current Every Day Smoker  . Smokeless tobacco: Never Used  Substance Use Topics  . Alcohol use: Never    Frequency: Never  . Drug use: Never     Allergies   Patient has no known allergies.   Review of Systems Review of Systems  Constitutional: Negative for fever.  Respiratory: Negative for shortness of breath.   Cardiovascular: Negative for chest pain and leg swelling.  Gastrointestinal: Negative for abdominal distention, abdominal pain, bowel incontinence and constipation.  Genitourinary: Negative for bladder incontinence, difficulty urinating, dysuria, flank pain, frequency and urgency.  Musculoskeletal: Positive for back pain. Negative for gait problem and joint swelling.  Skin: Negative for rash.  Neurological: Negative for tingling, weakness, numbness and paresthesias.     Physical Exam Updated Vital Signs BP (!) 159/80 (BP Location: Right Arm)   Pulse (!) 54   Temp 97.9 F (36.6 C) (Oral)   Resp 16   Ht 5\' 5"  (1.651 m)   Wt 79.4 kg (175 lb)   SpO2 98%   BMI 29.12 kg/m   Physical Exam  Constitutional: She appears well-developed and well-nourished.  HENT:  Head: Normocephalic.  Eyes: Conjunctivae are normal.  Neck: Normal range of motion. Neck supple.  Cardiovascular: Normal rate and intact distal pulses.  Pedal pulses normal.  Pulmonary/Chest:  Effort normal.  Abdominal: Soft. Bowel sounds are normal. She exhibits no distension and no mass. There is no guarding.  Musculoskeletal: Normal range of motion. She exhibits no edema.       Lumbar back: She exhibits tenderness. She exhibits no bony tenderness, no swelling, no edema and no spasm.       Back:  No midline ttp.  Positive SLR left approx 45 degrees.  Neurological: She is alert. She has normal strength. She displays no atrophy and no tremor. No sensory deficit. Gait normal.  Reflex Scores:      Patellar reflexes are 2+ on the  right side and 2+ on the left side.      Achilles reflexes are 2+ on the right side and 2+ on the left side. No strength deficit noted in hip and knee flexor and extensor muscle groups.  Ankle flexion and extension intact.  Skin: Skin is warm and dry.  Psychiatric: She has a normal mood and affect.  Nursing note and vitals reviewed.    ED Treatments / Results  Labs (all labs ordered are listed, but only abnormal results are displayed) Labs Reviewed - No data to display  EKG None  Radiology Dg Lumbar Spine Complete  Result Date: 10/23/2017 CLINICAL DATA:  Back pain for several days.  No known injury. EXAM: LUMBAR SPINE - COMPLETE 4+ VIEW COMPARISON:  None. FINDINGS: There are 5 non rib-bearing lumbar type vertebral bodies. There is minimal (approximately 4 mm) of anterolisthesis of L4 upon L5. No definite pars defects. Age-indeterminate mild (approximately 25%) compression deformity involving superior endplate of L3. Remaining lumbar vertebral body heights appear preserved Mild to moderate multilevel lumbar spine DDD, worse at L2-L3 and L3-L4 with disc space height loss, endplate irregularity and sclerosis. Limited visualization of the bilateral SI joints is normal. Multiple phleboliths overlie the lower pelvis bilaterally. Atherosclerotic plaque within the abdominal aorta. Nonobstructive bowel gas pattern. IMPRESSION: 1. Age-indeterminate mild (approximately 25%) compression deformity involving the superior endplate of L3. Correlation for point tenderness at this location is recommended. 2. Mild to moderate multilevel lumbar spine DDD. Electronically Signed   By: Simonne Come M.D.   On: 10/23/2017 19:38    Procedures Procedures (including critical care time)  Medications Ordered in ED Medications  ketorolac (TORADOL) 30 MG/ML injection 30 mg (30 mg Intramuscular Given 10/23/17 1916)  traMADol (ULTRAM) tablet 50 mg (50 mg Oral Given 10/23/17 2018)     Initial Impression / Assessment and  Plan / ED Course  I have reviewed the triage vital signs and the nursing notes.  Pertinent labs & imaging results that were available during my care of the patient were reviewed by me and considered in my medical decision making (see chart for details).     Pt with sciatica sx but also with degenerative changes and compression fracture, no hx of trauma, favoring subacute finding.  She was prescribed tramadol and naproxen, encouraged heat tx, home tx discussed, activities to avoid.  Referrals given for establishment of a pcp but also ortho f/u for further eval of her lumbar findings. No neuro deficit on exam or by history to suggest emergent or surgical presentation.  Discussed worsened sx that should prompt immediate re-evaluation including distal weakness, bowel/bladder retention/incontinence.        Final Clinical Impressions(s) / ED Diagnoses   Final diagnoses:  Sciatica of left side  DDD (degenerative disc disease), lumbar  Non-traumatic compression fracture of L3 lumbar vertebra, initial encounter [facility])    ED Discharge  Orders        Ordered    naproxen (NAPROSYN) 500 MG tablet  2 times daily     10/23/17 2011    traMADol (ULTRAM) 50 MG tablet  Every 6 hours PRN     10/23/17 2011       Victoriano Lain 10/23/17 2048    Rolland Porter, MD 10/23/17 [phone]

## 2018-12-09 ENCOUNTER — Encounter (HOSPITAL_COMMUNITY): Payer: Self-pay

## 2018-12-09 ENCOUNTER — Emergency Department (HOSPITAL_COMMUNITY): Payer: Medicare Other

## 2018-12-09 ENCOUNTER — Emergency Department (HOSPITAL_COMMUNITY)
Admission: EM | Admit: 2018-12-09 | Discharge: 2018-12-09 | Disposition: A | Discharge status: Home / Self Care | Payer: Medicare Other | Attending: Emergency Medicine | Admitting: Emergency Medicine

## 2018-12-09 ENCOUNTER — Other Ambulatory Visit: Payer: Self-pay

## 2018-12-09 DIAGNOSIS — J9801 Acute bronchospasm: Secondary | ICD-10-CM | POA: Diagnosis not present

## 2018-12-09 DIAGNOSIS — R9389 Abnormal findings on diagnostic imaging of other specified body structures: Secondary | ICD-10-CM | POA: Insufficient documentation

## 2018-12-09 DIAGNOSIS — F17210 Nicotine dependence, cigarettes, uncomplicated: Secondary | ICD-10-CM | POA: Diagnosis not present

## 2018-12-09 DIAGNOSIS — R05 Cough: Secondary | ICD-10-CM | POA: Diagnosis not present

## 2018-12-09 DIAGNOSIS — R000 Tachycardia, unspecified: Secondary | ICD-10-CM | POA: Diagnosis not present

## 2018-12-09 DIAGNOSIS — R0602 Shortness of breath: Secondary | ICD-10-CM | POA: Diagnosis not present

## 2018-12-09 DIAGNOSIS — R918 Other nonspecific abnormal finding of lung field: Secondary | ICD-10-CM | POA: Diagnosis not present

## 2018-12-09 MED ORDER — PREDNISONE 20 MG PO TABS: 40.0000 mg | ORAL_TABLET | Freq: Every day | ORAL | 0 refills | Status: AC

## 2018-12-09 MED ORDER — ALBUTEROL SULFATE HFA 108 (90 BASE) MCG/ACT IN AERS
2.0000 | INHALATION_SPRAY | RESPIRATORY_TRACT | Status: AC
  Administered 2018-12-09: 17:00:00 2 via RESPIRATORY_TRACT
  Filled 2018-12-09: qty 6.7

## 2018-12-09 MED ORDER — METHYLPREDNISOLONE SODIUM SUCC 125 MG IJ SOLR
125.0000 mg | Freq: Once | INTRAMUSCULAR | Status: AC
Start: 2018-12-09 — End: 2018-12-09
  Administered 2018-12-09: 125 mg via INTRAVENOUS
  Filled 2018-12-09: qty 2

## 2018-12-09 MED ORDER — ALBUTEROL (5 MG/ML) CONTINUOUS INHALATION SOLN
10.0000 mg/h | INHALATION_SOLUTION | RESPIRATORY_TRACT | Status: AC
  Administered 2018-12-09: 14:00:00 10 mg/h via RESPIRATORY_TRACT
  Filled 2018-12-09: qty 20

## 2018-12-09 MED ORDER — AEROCHAMBER PLUS FLO-VU MEDIUM MISC
1.0000 | Freq: Once | Status: AC
  Administered 2018-12-09: 17:00:00 1
  Filled 2018-12-09 (×2): qty 1

## 2018-12-09 NOTE — Discharge Instructions (Addendum)
Please take the albuterol inhaler, 2 puffs every 4 hours for the next 24 hours And make sure that you do 2 puffs and 10 breaths.  You will need to take prednisone, 40 mg daily for 5 days.  If you find that your shortness of breath is worsening or you are developing chest pain, become excessively sweaty or nauseated you will need to return to the emergency department immediately for repeat evaluation.     Your x-ray did show a possible nodule in your lung.  This will need to be repeated in 2 weeks.  Please let your family doctor know about this finding and have them order a repeat x-ray.  If you do not have a family doctor see the notes below.  When you are using household cleaners be careful not to mix different cleaners with each other as they can create toxic fumes.   [facility] Primary Care Doctor List    Kari Baars MD. Specialty: Pulmonary Disease Contact information: [address]  [address]  [place] [address]  [phone]   Syliva Overman, MD. Specialty: [facility] Medicine Contact information: [address]  [place] [address]  [phone]   Lilyan Punt, MD. Specialty: [facility] Medicine Contact information: [address]  [place] [address]  [phone]   Avon Gully, MD Specialty: Internal Medicine Contact information: [address] [place] [address]  [phone]   Catalina Pizza, MD. Specialty: Internal Medicine Contact information: [address]  [place] [address]  [phone]    [facility] (Dr. Selena Batten) Specialty: Family Medicine Contact information: [address]  [place] [address]  [phone]   John Giovanni, MD. Specialty: [facility] Medicine Contact information: [address]  [address]  [place] [address]  [phone]   Carylon Perches, MD. Specialty: Internal Medicine Contact information: [address]  [address]  [place] [address]  [phone]    [facility]  [address] [place], [place] [address] [phone]  Services [facility] offers a variety of basic health services.  Services include but are not limited to: Blood pressure checks  Heart rate checks  Blood sugar checks  Urine analysis  Rapid strep tests  Pregnancy tests.  Health education and referrals  People needing more complex services will be directed to a physician online. Using these virtual visits, doctors can evaluate and prescribe medicine and treatments. There will be no medication on-site, though [facility] will help patients fill their prescriptions at little to no cost.   For More information please go to: [email or web address]

## 2018-12-09 NOTE — ED Provider Notes (Signed)
[facility] EMERGENCY DEPARTMENT Provider Note   CSN: [id number] Arrival date & time: 12/09/18  1319    History   Chief Complaint Chief Complaint  Patient presents with  . Cough    HPI Jennifer Armstrong is a 68 y.o. female.     HPI  She is a pleasant 68 year old female, she has no history of COPD or emphysema in fact she has no lung disease though she does smoke cigarettes.  She presents after having acute onset of shortness of breath and coughing which occurred while she was trying to clean her bathtub mixing chemicals including a medicine called "turbo" and Clorox bleach.  She has continued to have severe shortness of breath since that time and is having difficulty speaking speaking in short sentences.  This is acute in onset, severe and persistent.  She has no history of ever using bronchodilator therapy.  Past Medical History:  Diagnosis Date  . Knee pain, right   . Tibial plateau fracture     Patient Active Problem List   Diagnosis Date Noted  . Tibial plateau fracture 02/09/2011  . S/p tibial fracture 02/09/2011    Past Surgical History:  Procedure Laterality Date  . ABDOMINAL HYSTERECTOMY    . KNEE SURGERY       OB History   No obstetric history on file.      Home Medications    Prior to Admission medications   Medication Sig Start Date End Date Taking? Authorizing Provider  naproxen (NAPROSYN) 500 MG tablet Take 1 tablet (500 mg total) by mouth 2 (two) times daily. Patient not taking: Reported on 12/09/2018 10/23/17   Burgess Amor, PA-C  predniSONE (DELTASONE) 20 MG tablet Take 2 tablets (40 mg total) by mouth daily for 5 days. 12/09/18 12/14/18  Eber Hong, MD  traMADol (ULTRAM) 50 MG tablet Take 1 tablet (50 mg total) by mouth every 6 (six) hours as needed. Patient not taking: Reported on 12/09/2018 10/23/17   Burgess Amor, PA-C    Family History No family history on file.  Social History Social History   Tobacco Use  . Smoking status: Current Every  Day Smoker    Packs/day: 0.50    Types: Cigarettes  . Smokeless tobacco: Never Used  Substance Use Topics  . Alcohol use: Never    Frequency: Never  . Drug use: Never     Allergies   Patient has no known allergies.   Review of Systems Review of Systems  All other systems reviewed and are negative.    Physical Exam Updated Vital Signs BP 112/63   Pulse (!) 122   Temp 98 F (36.7 C) (Oral)   Resp (!) 29   Ht 1.651 m ( )   Wt 79.4 kg   SpO2 100%   BMI 29.12 kg/m   Physical Exam Vitals signs and nursing note reviewed.  Constitutional:      General: She is not in acute distress.    Appearance: She is well-developed.  HENT:     Head: Normocephalic and atraumatic.     Mouth/Throat:     Pharynx: No oropharyngeal exudate.  Eyes:     General: No scleral icterus.       Right eye: No discharge.        Left eye: No discharge.     Conjunctiva/sclera: Conjunctivae normal.     Pupils: Pupils are equal, round, and reactive to light.  Neck:     Musculoskeletal: Normal range of motion and neck supple.  Thyroid: No thyromegaly.     Vascular: No JVD.  Cardiovascular:     Rate and Rhythm: Regular rhythm. Tachycardia present.     Heart sounds: Normal heart sounds. No murmur. No friction rub. No gallop.   Pulmonary:     Effort: Respiratory distress present.     Breath sounds: Wheezing present. No rales.     Comments: Increased work of breathing with acute respiratory distress tachypnea and mild accessory muscle use.  There is decreased lung sounds bilaterally with severe wheezing Abdominal:     General: Bowel sounds are normal. There is no distension.     Palpations: Abdomen is soft. There is no mass.     Tenderness: There is no abdominal tenderness.  Musculoskeletal: Normal range of motion.        General: No tenderness.  Lymphadenopathy:     Cervical: No cervical adenopathy.  Skin:    General: Skin is warm and dry.     Findings: No erythema or rash.   Neurological:     Mental Status: She is alert.     Coordination: Coordination normal.  Psychiatric:        Behavior: Behavior normal.      ED Treatments / Results  Labs (all labs ordered are listed, but only abnormal results are displayed) Labs Reviewed - No data to display  EKG EKG Interpretation  Date/Time:  Sunday Dec 09 2018 15:21:05 EDT Ventricular Rate:  134 PR Interval:    QRS Duration: 82 QT Interval:  292 QTC Calculation: 436 R Axis:   76 Text Interpretation:  Sinus tachycardia Atrial premature complex Repol abnrm suggests ischemia, diffuse leads Since last tracing rate faster Confirmed by Eber Hong ([id number]) on 12/09/2018 3:37:46 PM   Radiology Dg Chest Port 1 View  Result Date: 12/09/2018 CLINICAL DATA:  Cough and shortness of breath.  Chemical exposure. EXAM: PORTABLE CHEST 1 VIEW COMPARISON:  One-view chest x-ray 11/22/2010 FINDINGS: Heart size is normal. There is mild prominence of the interstitium diffusely. A nodular density is present in the right upper lobe. Other focal airspace disease or nodule is present. Mild degenerative changes are noted at the [facility] joints bilaterally. IMPRESSION: 1. Mild diffuse prominence of the interstitium may reflect changes of pneumonitis after exposure. 2. 7 cm nodular density in the right upper lobe. This could be related to atelectasis. Recommend follow-up two-view chest x-ray after clearing of acute symptoms. Electronically Signed   By: Marin Roberts M.D.   On: 12/09/2018 14:42    Procedures Procedures (including critical care time)  Medications Ordered in ED Medications  albuterol (PROVENTIL,VENTOLIN) solution continuous neb (0 mg/hr Nebulization Stopped 12/09/18 1519)  methylPREDNISolone sodium succinate (SOLU-MEDROL) 125 mg/2 mL injection 125 mg (125 mg Intravenous Given 12/09/18 1434)  albuterol (VENTOLIN HFA) 108 (90 Base) MCG/ACT inhaler 2 puff (2 puffs Inhalation Given 12/09/18 1716)  AeroChamber Plus Flo-Vu Medium  MISC 1 each (1 each Other Given 12/09/18 1716)     Initial Impression / Assessment and Plan / ED Course  I have reviewed the triage vital signs and the nursing notes.  Pertinent labs & imaging results that were available during my care of the patient were reviewed by me and considered in my medical decision making (see chart for details).  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  There is a nodule which needs to be followed up, the patient has been informed.   [  BM]  1455 The patient was reexamined while getting her continuous nebulizer therapy.  She is still tachycardic on the bronchodilator however her oxygen level is 100% and her breathing is less labored.  She has less wheezing though it is still very prominent.  Chest x-ray reveals no pneumothorax, she was updated.  The breathing treatment and a little bit   [BM]  1719 The patient was able to ambulate around the department, her oxygen got no lower than 92% and she did very well functionally.  She has been given a metered-dose inhaler of albuterol with an AeroChamber and has been instructed on its use.  On repeat exam several times her lungs continue to improve with less wheezing, speaking in full sentences without any accessory muscle use or retractions.  At this time the patient appears stable for discharge to follow-up in the outpatient setting.  She has been instructed on being more careful with chemicals for cleaning to avoid further episodes of bronchospasm.   [BM]    Clinical Course User Index [BM] Eber HongMiller, Tida Saner, MD       The patient is in acute respiratory distress which is likely bronchospasm secondary to chemical exposure.  She will need bronchodilator therapy, steroids and a chest x-ray to rule out complications such as pneumonitis.  Patient agreeable.  MDI dispensed with pt for home  Pt made aware of her abnormal xray and need for  f/u  Final Clinical Impressions(s) / ED Diagnoses   Final diagnoses:  Acute bronchospasm  Abnormal x-ray    ED Discharge Orders         Ordered    predniSONE (DELTASONE) 20 MG tablet  Daily     12/09/18 1720           Eber HongMiller, Cordella Nyquist, MD 12/09/18 1724

## 2018-12-09 NOTE — ED Notes (Signed)
Pt ambulated with no difficulty, gait was steady, and lowest O2 sat level was 92.

## 2018-12-09 NOTE — ED Triage Notes (Signed)
Pt reports cleaning tub with clorox and turbo clean and patient has been coughing since .Sob and Wheezing noted . approx 1 hour ago

## 2019-07-01 ENCOUNTER — Other Ambulatory Visit (HOSPITAL_COMMUNITY): Payer: Self-pay | Admitting: Internal Medicine

## 2019-07-01 DIAGNOSIS — Z1231 Encounter for screening mammogram for malignant neoplasm of breast: Secondary | ICD-10-CM

## 2019-08-28 ENCOUNTER — Other Ambulatory Visit: Payer: Self-pay

## 2019-08-28 ENCOUNTER — Ambulatory Visit: Payer: Medicare Other | Attending: Internal Medicine

## 2019-08-28 DIAGNOSIS — Z20822 Contact with and (suspected) exposure to covid-19: Secondary | ICD-10-CM

## 2019-08-30 LAB — NOVEL CORONAVIRUS, NAA: SARS-CoV-2, NAA: DETECTED — AB

## 2019-08-31 ENCOUNTER — Telehealth: Payer: Self-pay | Admitting: Physician Assistant

## 2019-08-31 NOTE — Telephone Encounter (Signed)
Called to discuss with patient about Covid symptoms and the use of bamlanivimab or casirivimab/imdevimab, a monoclonal antibody infusion for those with mild to moderate Covid symptoms and at a high risk of hospitalization.  Pt is qualified for this infusion at the [facility] due to Age > 65   Message left to call back  Lyndzee Kliebert PA-C  [facility]    

## 2019-09-23 LAB — COLOGUARD

## 2019-10-09 LAB — COLOGUARD

## 2019-11-02 LAB — COLOGUARD

## 2019-12-12 LAB — COLOGUARD

## 2020-08-04 ENCOUNTER — Other Ambulatory Visit: Payer: Medicare Other

## 2020-08-04 ENCOUNTER — Other Ambulatory Visit: Payer: Self-pay

## 2020-08-04 DIAGNOSIS — Z20822 Contact with and (suspected) exposure to covid-19: Secondary | ICD-10-CM

## 2020-08-06 LAB — SARS-COV-2, NAA 2 DAY TAT

## 2020-08-06 LAB — NOVEL CORONAVIRUS, NAA: SARS-CoV-2, NAA: NOT DETECTED

## 2021-02-17 LAB — HM HEPATITIS C SCREENING LAB: HM Hepatitis Screen: NEGATIVE

## 2021-03-07 NOTE — Progress Notes (Signed)
Subjective:    Patient ID: [name], female    DOB: [birth year], 70 y.o.   MRN: [id number]  HPI: Jennifer Armstrong is a 70 y.o. female presenting for new patient visit to establish care with niece, Jennifer Armstrong.  Introduced to Publishing rights manager role and practice setting.  All questions answered.  Discussed provider/patient relationship and expectations.  Chief Complaint  Patient presents with   New Patient (Initial Visit)   Does not have any questions/concerns today.  Has never had a primary care provider.  She is somewhat withdrawn and hesitant to answer a lot of my questions today.  TOBACCO USER Smoking Status: current smoker Smoking Amount: 0.5 ppd Smoking Onset: 70 years old Smoking Quit Date: not interested in quitting Smoking triggers: enjoyment, stress Type of tobacco use: cigarettes Treatments attempted: nothing Pneumovax: not up to date  No Known Allergies  Outpatient Encounter Medications as of 03/08/2021  Medication Sig   [DISCONTINUED] naproxen (NAPROSYN) 500 MG tablet Take 1 tablet (500 mg total) by mouth 2 (two) times daily. (Patient not taking: Reported on 12/09/2018)   [DISCONTINUED] traMADol (ULTRAM) 50 MG tablet Take 1 tablet (50 mg total) by mouth every 6 (six) hours as needed. (Patient not taking: Reported on 12/09/2018)   No facility-administered encounter medications on file as of 03/08/2021.    Active Ambulatory Problems    Diagnosis Date Noted   Tibial plateau fracture 02/09/2011   S/p tibial fracture 02/09/2011   Murmur, cardiac 03/08/2021   Elevated blood pressure reading without diagnosis of hypertension 03/08/2021   Tobacco use 03/08/2021   Resolved Ambulatory Problems    Diagnosis Date Noted   Stiffness of joint, not elsewhere classified, lower leg 02/09/2011   Past Medical History:  Diagnosis Date   Knee pain, right     Past Medical History:  Diagnosis Date   Knee pain, right    Tibial plateau fracture     Past Surgical History:   Procedure Laterality Date   ABDOMINAL HYSTERECTOMY     KNEE SURGERY      Social History   Tobacco Use   Smoking status: Every Day    Packs/day: 0.50    Types: Cigarettes    Start date: 07/25/1977   Smokeless tobacco: Never  Vaping Use   Vaping Use: Never used  Substance Use Topics   Alcohol use: Not Currently   Drug use: Never    Family History  Problem Relation Age of Onset   Cancer Mother    Heart attack Father    Sickle cell trait Son    Sickle cell trait Son    Sickle cell anemia Daughter     Review of Systems  Constitutional: Negative.  Negative for activity change, appetite change, chills and fatigue.  Eyes: Negative.   Respiratory: Negative.  Negative for chest tightness, shortness of breath and wheezing.   Cardiovascular: Negative.  Negative for chest pain, palpitations and leg swelling.  Gastrointestinal: Negative.   Skin: Negative.   Neurological: Negative.   Psychiatric/Behavioral: Negative.    Per HPI unless specifically indicated above     Objective:    BP (!) 164/100   Pulse 96   Temp 98.7 F (37.1 C)   Ht 5\' 5"  (1.651 m)   Wt 168 lb 6.4 oz (76.4 kg)   SpO2 97%   BMI 28.02 kg/m   Wt Readings from Last 3 Encounters:  03/08/21 168 lb 6.4 oz (76.4 kg)  12/09/18 175 lb (79.4 kg)  10/23/17 175 lb (  79.4 kg)    Physical Exam Vitals and nursing note reviewed.  Constitutional:      General: She is not in acute distress.    Appearance: Normal appearance. She is obese. She is not toxic-appearing.  HENT:     Head: Normocephalic and atraumatic.     Right Ear: External ear normal.     Left Ear: External ear normal.  Eyes:     General: No scleral icterus.    Extraocular Movements: Extraocular movements intact.  Neck:     Vascular: No carotid bruit.  Cardiovascular:     Rate and Rhythm: Normal rate and regular rhythm.     Heart sounds: Murmur heard.  Pulmonary:     Effort: Pulmonary effort is normal. No respiratory distress.     Breath sounds:  Normal breath sounds. No wheezing, rhonchi or rales.  Musculoskeletal:        General: No swelling or tenderness. Normal range of motion.     Cervical back: Normal range of motion.  Skin:    General: Skin is warm and dry.     Capillary Refill: Capillary refill takes less than 2 seconds.     Coloration: Skin is not jaundiced.     Findings: No bruising.  Neurological:     General: No focal deficit present.     Mental Status: She is alert and oriented to person, place, and time.     Motor: No weakness.     Gait: Gait normal.  Psychiatric:        Mood and Affect: Mood normal.        Behavior: Behavior normal.        Thought Content: Thought content normal.        Judgment: Judgment normal.      Assessment & Plan:   Problem List Items Addressed This Visit       Other   Tobacco use    Given use of tobacco for a number of years, she does qualify for low-dose CT scan for lung cancer screening.  She wishes to think about this for now.  We will check lipids today, although patient is not fasting.  I educated her that smoking places her at high risk for cardiovascular event like heart attack or stroke.  I encouraged cutting back to completely quitting.  She is in the precontemplative phase and not interested in quitting smoking at this time.      Murmur, cardiac    Echocardiogram ordered today.  Will also check baseline blood work.      Relevant Orders   ECHOCARDIOGRAM COMPLETE   Elevated blood pressure reading without diagnosis of hypertension    Chronic.  Blood pressure significantly elevated, although I do suspect some nervousness with being in a providers office for the first time in a number of years.  I gave her a written prescription for a blood pressure cuff and encouraged her to monitor her blood pressure at home.  We will check labs today and follow-up in 2 to 4 weeks to recheck blood pressure and discuss lab work.  In the meantime, if her blood pressure stays elevated and she  starts having chest pain, shortness of breath, dizziness, lightheadedness, or headache, she needs to seek urgent care.      Other Visit Diagnoses     Encounter to establish care    -  Primary   Encounter for screening mammogram for malignant neoplasm of breast       Screening for osteoporosis  Relevant Orders   DG Bone Density   Encounter for lipid screening for cardiovascular disease       Relevant Orders   Lipid panel (Completed)   Screening for iron deficiency anemia       Relevant Orders   CBC with Differential/Platelet (Completed)   Screening for metabolic disorder       Relevant Orders   COMPLETE METABOLIC PANEL WITH GFR (Completed)   Screening for thyroid disorder       Relevant Orders   TSH (Completed)   Screening for diabetes mellitus       Relevant Orders   Hemoglobin A1c (Completed)   Onychauxis       Relevant Orders   Ambulatory referral to Podiatry        Follow up plan: Return for 2-4 weeks BP follow up & discuss labs.

## 2021-03-08 ENCOUNTER — Other Ambulatory Visit (HOSPITAL_COMMUNITY): Payer: Self-pay | Admitting: Nurse Practitioner

## 2021-03-08 ENCOUNTER — Ambulatory Visit (INDEPENDENT_AMBULATORY_CARE_PROVIDER_SITE_OTHER): Payer: Medicare Other | Admitting: Nurse Practitioner

## 2021-03-08 ENCOUNTER — Encounter: Payer: Self-pay | Admitting: Nurse Practitioner

## 2021-03-08 ENCOUNTER — Other Ambulatory Visit: Payer: Self-pay

## 2021-03-08 VITALS — BP 164/100 | HR 96 | Temp 98.7°F | Ht 65.0 in | Wt 168.4 lb

## 2021-03-08 DIAGNOSIS — R030 Elevated blood-pressure reading, without diagnosis of hypertension: Secondary | ICD-10-CM | POA: Diagnosis not present

## 2021-03-08 DIAGNOSIS — Z720 Tobacco use: Secondary | ICD-10-CM | POA: Insufficient documentation

## 2021-03-08 DIAGNOSIS — Z13220 Encounter for screening for lipoid disorders: Secondary | ICD-10-CM

## 2021-03-08 DIAGNOSIS — Z13820 Encounter for screening for osteoporosis: Secondary | ICD-10-CM

## 2021-03-08 DIAGNOSIS — Z1231 Encounter for screening mammogram for malignant neoplasm of breast: Secondary | ICD-10-CM

## 2021-03-08 DIAGNOSIS — Z7689 Persons encountering health services in other specified circumstances: Secondary | ICD-10-CM | POA: Diagnosis not present

## 2021-03-08 DIAGNOSIS — Z136 Encounter for screening for cardiovascular disorders: Secondary | ICD-10-CM

## 2021-03-08 DIAGNOSIS — Z130 Encounter for screening for diseases of the blood and blood-forming organs and certain disorders involving the immune mechanism: Secondary | ICD-10-CM

## 2021-03-08 DIAGNOSIS — Z131 Encounter for screening for diabetes mellitus: Secondary | ICD-10-CM

## 2021-03-08 DIAGNOSIS — Z1329 Encounter for screening for other suspected endocrine disorder: Secondary | ICD-10-CM

## 2021-03-08 DIAGNOSIS — R011 Cardiac murmur, unspecified: Secondary | ICD-10-CM | POA: Diagnosis not present

## 2021-03-08 DIAGNOSIS — I10 Essential (primary) hypertension: Secondary | ICD-10-CM | POA: Insufficient documentation

## 2021-03-08 DIAGNOSIS — Z13228 Encounter for screening for other metabolic disorders: Secondary | ICD-10-CM

## 2021-03-08 DIAGNOSIS — L602 Onychogryphosis: Secondary | ICD-10-CM

## 2021-03-08 NOTE — Assessment & Plan Note (Signed)
Echocardiogram ordered today.  Will also check baseline blood work.

## 2021-03-09 LAB — CBC WITH DIFFERENTIAL/PLATELET
Absolute Monocytes: 468 cells/uL (ref 200–950)
Basophils Absolute: 22 cells/uL (ref 0–200)
Basophils Relative: 0.3 %
Eosinophils Absolute: 187 cells/uL (ref 15–500)
Eosinophils Relative: 2.6 %
HCT: 38.5 % (ref 35.0–45.0)
Hemoglobin: 12.8 g/dL (ref 11.7–15.5)
Lymphs Abs: 1951 cells/uL (ref 850–3900)
MCH: 31.1 pg (ref 27.0–33.0)
MCHC: 33.2 g/dL (ref 32.0–36.0)
MCV: 93.7 fL (ref 80.0–100.0)
MPV: 11.8 fL (ref 7.5–12.5)
Monocytes Relative: 6.5 %
Neutro Abs: 4572 cells/uL (ref 1500–7800)
Neutrophils Relative %: 63.5 %
Platelets: 271 10*3/uL (ref 140–400)
RBC: 4.11 10*6/uL (ref 3.80–5.10)
RDW: 12.6 % (ref 11.0–15.0)
Total Lymphocyte: 27.1 %
WBC: 7.2 10*3/uL (ref 3.8–10.8)

## 2021-03-09 LAB — COMPLETE METABOLIC PANEL WITH GFR
AG Ratio: 1.1 (calc) (ref 1.0–2.5)
ALT: 7 U/L (ref 6–29)
AST: 9 U/L — ABNORMAL LOW (ref 10–35)
Albumin: 3.9 g/dL (ref 3.6–5.1)
Alkaline phosphatase (APISO): 79 U/L (ref 37–153)
BUN: 8 mg/dL (ref 7–25)
CO2: 28 mmol/L (ref 20–32)
Calcium: 9.7 mg/dL (ref 8.6–10.4)
Chloride: 105 mmol/L (ref 98–110)
Creat: 0.79 mg/dL (ref 0.50–1.05)
Globulin: 3.4 g/dL (calc) (ref 1.9–3.7)
Glucose, Bld: 119 mg/dL — ABNORMAL HIGH (ref 65–99)
Potassium: 4.1 mmol/L (ref 3.5–5.3)
Sodium: 142 mmol/L (ref 135–146)
Total Bilirubin: 0.3 mg/dL (ref 0.2–1.2)
Total Protein: 7.3 g/dL (ref 6.1–8.1)
eGFR: 82 mL/min/{1.73_m2} (ref >=60)

## 2021-03-09 LAB — LIPID PANEL
Cholesterol: 222 mg/dL — ABNORMAL HIGH (ref <200)
HDL: 57 mg/dL (ref >=50)
LDL Cholesterol (Calc): 134 mg/dL (calc) — ABNORMAL HIGH
Non-HDL Cholesterol (Calc): 165 mg/dL (calc) — ABNORMAL HIGH (ref <130)
Total CHOL/HDL Ratio: 3.9 (calc) (ref <5.0)
Triglycerides: 171 mg/dL — ABNORMAL HIGH (ref <150)

## 2021-03-09 LAB — TSH: TSH: 3.26 mIU/L (ref 0.40–4.50)

## 2021-03-09 LAB — HEMOGLOBIN A1C
Hgb A1c MFr Bld: 5.4 % of total Hgb (ref <5.7)
Mean Plasma Glucose: 108 mg/dL
eAG (mmol/L): 6 mmol/L

## 2021-03-10 ENCOUNTER — Other Ambulatory Visit: Payer: Self-pay | Admitting: *Deleted

## 2021-03-10 MED ORDER — ATORVASTATIN CALCIUM 20 MG PO TABS: 20.0000 mg | ORAL_TABLET | Freq: Every day | ORAL | 3 refills | Status: DC

## 2021-03-10 NOTE — Assessment & Plan Note (Signed)
Chronic.  Blood pressure significantly elevated, although I do suspect some nervousness with being in a providers office for the first time in a number of years.  I gave her a written prescription for a blood pressure cuff and encouraged her to monitor her blood pressure at home.  We will check labs today and follow-up in 2 to 4 weeks to recheck blood pressure and discuss lab work.  In the meantime, if her blood pressure stays elevated and she starts having chest pain, shortness of breath, dizziness, lightheadedness, or headache, she needs to seek urgent care.

## 2021-03-10 NOTE — Assessment & Plan Note (Signed)
Given use of tobacco for a number of years, she does qualify for low-dose CT scan for lung cancer screening.  She wishes to think about this for now.  We will check lipids today, although patient is not fasting.  I educated her that smoking places her at high risk for cardiovascular event like heart attack or stroke.  I encouraged cutting back to completely quitting.  She is in the precontemplative phase and not interested in quitting smoking at this time.

## 2021-03-15 ENCOUNTER — Ambulatory Visit (HOSPITAL_COMMUNITY)
Admission: RE | Admit: 2021-03-15 | Discharge: 2021-03-15 | Disposition: A | Discharge status: Home / Self Care | Payer: Medicare Other | Source: Ambulatory Visit | Attending: Nurse Practitioner | Admitting: Nurse Practitioner

## 2021-03-15 ENCOUNTER — Other Ambulatory Visit: Payer: Self-pay

## 2021-03-15 DIAGNOSIS — Z1231 Encounter for screening mammogram for malignant neoplasm of breast: Secondary | ICD-10-CM | POA: Insufficient documentation

## 2021-03-15 DIAGNOSIS — Z780 Asymptomatic menopausal state: Secondary | ICD-10-CM | POA: Diagnosis not present

## 2021-03-15 DIAGNOSIS — Z13820 Encounter for screening for osteoporosis: Secondary | ICD-10-CM | POA: Insufficient documentation

## 2021-03-15 DIAGNOSIS — M810 Age-related osteoporosis without current pathological fracture: Secondary | ICD-10-CM | POA: Diagnosis not present

## 2021-04-04 NOTE — Progress Notes (Signed)
Subjective:    Patient ID: Jennifer Armstrong, female    DOB: 18-Sep-1950, 70 y.o.   MRN: [id number]  HPI: Jennifer Armstrong is a 70 y.o. female presenting for follow up.  Chief Complaint  Patient presents with   Follow-up    3 follow up , following up for b/p , pt has her readings with her    HYPERTENSION/HYPERLIPIDEMIA Currently taking atorvastatin 20 mg daily and tolerating this well.  Not taking anything for blood pressure, she did bring log with her today.  She has cut back on cigarettes. Hypertension status: better  Satisfied with current treatment? yes Duration of hypertension: unknown BP monitoring frequency:  daily in the morning BP range: 120s/90s; initially elevated in August, improved significantly in September.  BP medication side effects:  n/a Medication compliance: n/a Aspirin: no Recurrent headaches: no Visual changes: no Palpitations: no Dyspnea: no Chest pain: no Lower extremity edema: no Dizzy/lightheaded: no The 10-year ASCVD risk score (Arnett DK, et al., 2019) is: 25.6%   Values used to calculate the score:     Age: 63 years     Sex: Female     Is Non-Hispanic African American: Yes     Diabetic: No     Tobacco smoker: Yes     Systolic Blood Pressure: 148 mmHg     Is BP treated: No     HDL Cholesterol: 57 mg/dL     Total Cholesterol: 222 mg/dL Family history CAD:  yes LDL Goal: less than 100  OSTEOPOROSIS DEXA scan showed osteoporosis of right femur.  She is currently smoking but is cutting back.  She does not take daily multivitamin.  Interested in treatment -does not want to fall.  No recent falls. Satisfied with current treatment?: n/a Medication side effects: n/a Medication compliance: n/a Past osteoporosis medications/treatments:  none Adequate calcium & vitamin D: calcium - yes Checking Vitamin D today Intolerance to bisphosphonates: n/a Weight bearing exercises: n/a  She is only smoking 3 cigarettes per day.  Before, she was smoking  at least 6 per day.   CHRONIC KIDNEY DISEASE CKD status: stable Medications renally dose: yes Previous renal evaluation: no Pneumovax:  Not up to Date Influenza Vaccine:  Not up to Date  No Known Allergies  Outpatient Encounter Medications as of 04/05/2021  Medication Sig   atorvastatin (LIPITOR) 20 MG tablet Take 1 tablet (20 mg total) by mouth daily.   No facility-administered encounter medications on file as of 04/05/2021.    Patient Active Problem List   Diagnosis Date Noted   Age-related osteoporosis without current pathological fracture 04/08/2021   Mixed hyperlipidemia 04/08/2021   CKD (chronic kidney disease), stage II 04/08/2021   Murmur, cardiac 03/08/2021   Elevated blood pressure reading without diagnosis of hypertension 03/08/2021   Tobacco use 03/08/2021   Tibial plateau fracture 02/09/2011   S/p tibial fracture 02/09/2011    Past Medical History:  Diagnosis Date   Knee pain, right    Tibial plateau fracture     Relevant past medical, surgical, family and social history reviewed and updated as indicated. Interim medical history since our last visit reviewed.  Review of Systems Per HPI unless specifically indicated above     Objective:    BP (!) 148/78 (BP Location: Left Arm, Patient Position: Sitting, Cuff Size: Normal)   Pulse 73   Ht 5\' 5"  (1.651 m)   Wt 166 lb 6.4 oz (75.5 kg)   SpO2 98%   BMI 27.69 kg/m  Wt Readings from Last 3 Encounters:  04/05/21 166 lb 6.4 oz (75.5 kg)  03/08/21 168 lb 6.4 oz (76.4 kg)  12/09/18 175 lb (79.4 kg)    Physical Exam Vitals and nursing note reviewed.  Constitutional:      General: She is not in acute distress.    Appearance: Normal appearance. She is not toxic-appearing.  HENT:     Head: Normocephalic and atraumatic.     Right Ear: External ear normal.     Left Ear: External ear normal.  Eyes:     General: No scleral icterus.    Extraocular Movements: Extraocular movements intact.  Neck:      Vascular: No carotid bruit.  Cardiovascular:     Rate and Rhythm: Normal rate and regular rhythm.     Heart sounds: Murmur heard.  Pulmonary:     Effort: Pulmonary effort is normal. No respiratory distress.     Breath sounds: Normal breath sounds. No wheezing, rhonchi or rales.  Abdominal:     General: Abdomen is flat. Bowel sounds are normal.     Palpations: Abdomen is soft.  Musculoskeletal:     Cervical back: Normal range of motion.  Skin:    General: Skin is warm and dry.     Capillary Refill: Capillary refill takes less than 2 seconds.     Coloration: Skin is not jaundiced.     Findings: No bruising.  Neurological:     Mental Status: She is alert and oriented to person, place, and time.     Motor: No weakness.     Gait: Gait normal.  Psychiatric:        Mood and Affect: Mood normal.        Behavior: Behavior normal.        Thought Content: Thought content normal.        Judgment: Judgment normal.      Assessment & Plan:   Problem List Items Addressed This Visit       Musculoskeletal and Integument   Age-related osteoporosis without current pathological fracture - Primary    Recent bone density scan showed osteoporosis in right femur.  Discussed bisphosphonate therapy-patient is agreeable to start.  We will first check vitamin D.  Recent calcium was normal.  Vitamin D low, we will need to delay bisphosphonate until vitamin D level is normal.  Follow-up pending blood work.      Relevant Orders   VITAMIN D 25 Hydroxy (Vit-D Deficiency, Fractures) (Completed)     Genitourinary   CKD (chronic kidney disease), stage II    Chronic.  Discussed this with patient.  Avoid nephrotoxic agents including NSAIDs.  Encouraged plenty of hydration with water.  It sounds like blood pressure is doing much better at home.  Blood pressure slightly elevated today in clinic.  We will continue to monitor closely-can consider low-dose ACE inhibitor or ARB if blood pressure remains above goal of  less than 130/80.  Follow-up in 3 months.  Continue checking blood pressure at home.        Other   Tobacco use    Congratulated on cutting back.  Encouraged continuing to cut back-goal is complete cessation.  Patient in agreement and reports she will continue work on this.  Consider low-dose CT in future-she should qualify.      Murmur, cardiac    Pending echocardiogram.        Mixed hyperlipidemia    Chronic.  Patient is tolerating atorvastatin 20 mg daily well.  Echocardiogram is pending for murmur.  We will recheck liver enzymes today.  Plan to recheck lipids in 3 months when she returns for fasting blood work and office visit.  Continue atorvastatin 20 mg daily in the meantime.      Relevant Orders   COMPLETE METABOLIC PANEL WITH GFR (Completed)   Elevated blood pressure reading without diagnosis of hypertension    Blood pressure is much improved today from last time she was in the office.  She has been taking her blood pressure at home and brings a log with her today.  Her numbers over the past couple weeks have been in the 120s systolically and 80s to 90s diastolic.  We will continue to hold off on treatment for now, however can consider ACE inhibitor or ARB if blood pressure remains an ongoing issue.  Follow-up in 3 months.        Follow up plan: Return in about 3 months (around 07/05/2021) for hld, osteoporosis.

## 2021-04-05 ENCOUNTER — Ambulatory Visit (INDEPENDENT_AMBULATORY_CARE_PROVIDER_SITE_OTHER): Payer: Medicare Other | Admitting: Nurse Practitioner

## 2021-04-05 ENCOUNTER — Other Ambulatory Visit: Payer: Self-pay

## 2021-04-05 VITALS — BP 148/78 | HR 73 | Ht 65.0 in | Wt 166.4 lb

## 2021-04-05 DIAGNOSIS — M810 Age-related osteoporosis without current pathological fracture: Secondary | ICD-10-CM | POA: Diagnosis not present

## 2021-04-05 DIAGNOSIS — Z720 Tobacco use: Secondary | ICD-10-CM | POA: Diagnosis not present

## 2021-04-05 DIAGNOSIS — E782 Mixed hyperlipidemia: Secondary | ICD-10-CM

## 2021-04-05 DIAGNOSIS — R011 Cardiac murmur, unspecified: Secondary | ICD-10-CM

## 2021-04-05 DIAGNOSIS — N182 Chronic kidney disease, stage 2 (mild): Secondary | ICD-10-CM

## 2021-04-05 DIAGNOSIS — R030 Elevated blood-pressure reading, without diagnosis of hypertension: Secondary | ICD-10-CM

## 2021-04-06 ENCOUNTER — Telehealth: Payer: Self-pay | Admitting: Nurse Practitioner

## 2021-04-06 LAB — COMPLETE METABOLIC PANEL WITH GFR
AG Ratio: 1.4 (calc) (ref 1.0–2.5)
ALT: 10 U/L (ref 6–29)
AST: 14 U/L (ref 10–35)
Albumin: 4.2 g/dL (ref 3.6–5.1)
Alkaline phosphatase (APISO): 78 U/L (ref 37–153)
BUN: 7 mg/dL (ref 7–25)
CO2: 27 mmol/L (ref 20–32)
Calcium: 9.5 mg/dL (ref 8.6–10.4)
Chloride: 104 mmol/L (ref 98–110)
Creat: 0.79 mg/dL (ref 0.50–1.05)
Globulin: 3 g/dL (calc) (ref 1.9–3.7)
Glucose, Bld: 133 mg/dL — ABNORMAL HIGH (ref 65–99)
Potassium: 4 mmol/L (ref 3.5–5.3)
Sodium: 141 mmol/L (ref 135–146)
Total Bilirubin: 0.4 mg/dL (ref 0.2–1.2)
Total Protein: 7.2 g/dL (ref 6.1–8.1)
eGFR: 82 mL/min/{1.73_m2} (ref >=60)

## 2021-04-06 LAB — VITAMIN D 25 HYDROXY (VIT D DEFICIENCY, FRACTURES): Vit D, 25-Hydroxy: 17 ng/mL — ABNORMAL LOW (ref 30–100)

## 2021-04-06 NOTE — Telephone Encounter (Signed)
Patient returned missed call received just a few minutes ago; unsure of who called her.

## 2021-04-08 ENCOUNTER — Other Ambulatory Visit: Payer: Self-pay

## 2021-04-08 ENCOUNTER — Encounter: Payer: Self-pay | Admitting: *Deleted

## 2021-04-08 ENCOUNTER — Ambulatory Visit (HOSPITAL_COMMUNITY)
Admission: RE | Admit: 2021-04-08 | Discharge: 2021-04-08 | Disposition: A | Discharge status: Home / Self Care | Payer: Medicare Other | Source: Ambulatory Visit | Attending: Nurse Practitioner | Admitting: Nurse Practitioner

## 2021-04-08 ENCOUNTER — Encounter: Payer: Self-pay | Admitting: Nurse Practitioner

## 2021-04-08 DIAGNOSIS — M810 Age-related osteoporosis without current pathological fracture: Secondary | ICD-10-CM | POA: Insufficient documentation

## 2021-04-08 DIAGNOSIS — R011 Cardiac murmur, unspecified: Secondary | ICD-10-CM | POA: Diagnosis not present

## 2021-04-08 DIAGNOSIS — N182 Chronic kidney disease, stage 2 (mild): Secondary | ICD-10-CM | POA: Insufficient documentation

## 2021-04-08 DIAGNOSIS — E782 Mixed hyperlipidemia: Secondary | ICD-10-CM | POA: Insufficient documentation

## 2021-04-08 NOTE — Assessment & Plan Note (Addendum)
Chronic.  Discussed this with patient.  Avoid nephrotoxic agents including NSAIDs.  Encouraged plenty of hydration with water.  It sounds like blood pressure is doing much better at home.  Blood pressure slightly elevated today in clinic.  We will continue to monitor closely-can consider low-dose ACE inhibitor or ARB if blood pressure remains above goal of less than 130/80.  Follow-up in 3 months.  Continue checking blood pressure at home.

## 2021-04-08 NOTE — Assessment & Plan Note (Signed)
Recent bone density scan showed osteoporosis in right femur.  Discussed bisphosphonate therapy-patient is agreeable to start.  We will first check vitamin D.  Recent calcium was normal.  Vitamin D low, we will need to delay bisphosphonate until vitamin D level is normal.  Follow-up pending blood work.

## 2021-04-08 NOTE — Assessment & Plan Note (Addendum)
Congratulated on cutting back.  Encouraged continuing to cut back-goal is complete cessation.  Patient in agreement and reports she will continue work on this.  Consider low-dose CT in future-she should qualify.

## 2021-04-08 NOTE — Assessment & Plan Note (Signed)
Blood pressure is much improved today from last time she was in the office.  She has been taking her blood pressure at home and brings a log with her today.  Her numbers over the past couple weeks have been in the 120s systolically and 80s to 90s diastolic.  We will continue to hold off on treatment for now, however can consider ACE inhibitor or ARB if blood pressure remains an ongoing issue.  Follow-up in 3 months.

## 2021-04-08 NOTE — Assessment & Plan Note (Signed)
Pending echo cardiogram

## 2021-04-08 NOTE — Assessment & Plan Note (Addendum)
Chronic.  Patient is tolerating atorvastatin 20 mg daily well.  Echocardiogram is pending for murmur.  We will recheck liver enzymes today.  Plan to recheck lipids in 3 months when she returns for fasting blood work and office visit.  Continue atorvastatin 20 mg daily in the meantime.

## 2021-04-08 NOTE — Progress Notes (Signed)
*  PRELIMINARY RESULTS* Echocardiogram 2D Echocardiogram has been performed.  Stacey Drain 04/08/2021, 3:33 PM

## 2021-04-09 LAB — ECHOCARDIOGRAM COMPLETE
AR max vel: 1.92 cm2
AV Area VTI: 1.94 cm2
AV Area mean vel: 2.08 cm2
AV Mean grad: 7 mmHg
AV Peak grad: 13.2 mmHg
Ao pk vel: 1.82 m/s
Area-P 1/2: 2.24 cm2
S' Lateral: 1.9 cm

## 2021-07-05 ENCOUNTER — Ambulatory Visit (INDEPENDENT_AMBULATORY_CARE_PROVIDER_SITE_OTHER): Payer: Medicare Other | Admitting: Nurse Practitioner

## 2021-07-05 ENCOUNTER — Other Ambulatory Visit: Payer: Self-pay

## 2021-07-05 ENCOUNTER — Encounter: Payer: Self-pay | Admitting: Nurse Practitioner

## 2021-07-05 VITALS — BP 142/80 | HR 81 | Ht 65.0 in | Wt 164.0 lb

## 2021-07-05 DIAGNOSIS — E782 Mixed hyperlipidemia: Secondary | ICD-10-CM | POA: Diagnosis not present

## 2021-07-05 DIAGNOSIS — R030 Elevated blood-pressure reading, without diagnosis of hypertension: Secondary | ICD-10-CM

## 2021-07-05 DIAGNOSIS — E559 Vitamin D deficiency, unspecified: Secondary | ICD-10-CM | POA: Diagnosis not present

## 2021-07-05 DIAGNOSIS — M810 Age-related osteoporosis without current pathological fracture: Secondary | ICD-10-CM

## 2021-07-05 DIAGNOSIS — Z1211 Encounter for screening for malignant neoplasm of colon: Secondary | ICD-10-CM

## 2021-07-05 DIAGNOSIS — Z720 Tobacco use: Secondary | ICD-10-CM

## 2021-07-05 DIAGNOSIS — N182 Chronic kidney disease, stage 2 (mild): Secondary | ICD-10-CM | POA: Diagnosis not present

## 2021-07-05 NOTE — Assessment & Plan Note (Signed)
Chronic.  She is continue to cut back and is now down to 3 cigarettes per day.  I congratulated her on this and encouraged cutting back even further.  Goal is complete cessation.  Follow up 3 months.

## 2021-07-05 NOTE — Progress Notes (Signed)
Subjective:    Patient ID: Jennifer Armstrong, female    DOB: [birth year], 70 y.o.   MRN: [id number]  HPI: Jennifer Armstrong is a 70 y.o. female presenting for follow up.  Chief Complaint  Patient presents with   Follow-up   HYPERLIPIDEMIA Currently taking atorvastatin 20 mg daily and tolerating this well.   Hyperlipidemia status: due for recheck Satisfied with current treatment?  yes Side effects:  no Medication compliance: excellent compliance Past cholesterol meds: n/a Aspirin:  no The 10-year ASCVD risk score (Arnett DK, et al., 2019) is: 25.2%   Values used to calculate the score:     Age: 25 years     Sex: Female     Is Non-Hispanic African American: Yes     Diabetic: No     Tobacco smoker: Yes     Systolic Blood Pressure: 142 mmHg     Is BP treated: No     HDL Cholesterol: 57 mg/dL     Total Cholesterol: 222 mg/dL Chest pain:  no Myalgias: no Coronary artery disease:  no Family history CAD:  no Family history early CAD:  no LDL goal: less than 100  VITAMIN D DEFICIENCY Duration: months Previous Vitamin D level: 17 Current supplementation: vitamin D3 1000 IU daily Complications of low vitamin d: osteoporosis   TOBACCO USER Smoking Status: current every day smoker Smoking Amount: 3 cigarettes per day  Smoking triggers: stress Type of tobacco use: cigarettes Other household members who smoke: yes Treatments attempted: cutting back to quit Pneumovax: declines  She mentions she ate some chili beans yesterday and she thinks they made her sick.  No Known Allergies  Outpatient Encounter Medications as of 07/05/2021  Medication Sig   atorvastatin (LIPITOR) 20 MG tablet Take 1 tablet (20 mg total) by mouth daily.   No facility-administered encounter medications on file as of 07/05/2021.    Patient Active Problem List   Diagnosis Date Noted   Vitamin D deficiency 07/05/2021   Age-related osteoporosis without current pathological fracture 04/08/2021    Mixed hyperlipidemia 04/08/2021   CKD (chronic kidney disease), stage II 04/08/2021   Murmur, cardiac 03/08/2021   Elevated blood pressure reading without diagnosis of hypertension 03/08/2021   Tobacco use 03/08/2021   Tibial plateau fracture 02/09/2011   S/p tibial fracture 02/09/2011    Past Medical History:  Diagnosis Date   Knee pain, right    Tibial plateau fracture     Relevant past medical, surgical, family and social history reviewed and updated as indicated. Interim medical history since our last visit reviewed.  Review of Systems Per HPI unless specifically indicated above     Objective:    BP (!) 142/80   Pulse 81   Ht 5\' 5"  (1.651 m)   Wt 164 lb (74.4 kg)   SpO2 95%   BMI 27.29 kg/m   Wt Readings from Last 3 Encounters:  07/05/21 164 lb (74.4 kg)  04/05/21 166 lb 6.4 oz (75.5 kg)  03/08/21 168 lb 6.4 oz (76.4 kg)    Physical Exam Vitals and nursing note reviewed.  Constitutional:      General: She is not in acute distress.    Appearance: Normal appearance. She is not toxic-appearing.  Neck:     Vascular: No carotid bruit.  Cardiovascular:     Rate and Rhythm: Normal rate and regular rhythm.     Heart sounds: Murmur heard.  Pulmonary:     Effort: Pulmonary effort is normal. No respiratory  distress.     Breath sounds: Normal breath sounds. No wheezing, rhonchi or rales.  Abdominal:     General: Abdomen is flat. Bowel sounds are normal.     Palpations: Abdomen is soft.  Musculoskeletal:     Cervical back: Normal range of motion.     Right lower leg: No edema.     Left lower leg: No edema.  Skin:    General: Skin is warm and dry.     Capillary Refill: Capillary refill takes less than 2 seconds.     Coloration: Skin is not jaundiced or pale.     Findings: No bruising or erythema.  Neurological:     Mental Status: She is alert and oriented to person, place, and time.     Motor: No weakness.     Gait: Gait normal.  Psychiatric:        Mood and  Affect: Mood normal.        Behavior: Behavior normal.        Thought Content: Thought content normal.        Judgment: Judgment normal.      Assessment & Plan:   Problem List Items Addressed This Visit       Musculoskeletal and Integument   Age-related osteoporosis without current pathological fracture - Primary    Chronic.  Tolerating Vitamin D well. Will recheck in 3 months at next appointment.  If Vitamin D normal at next appointment, we can start bisphosphonate.         Other   Vitamin D deficiency    Chronic.  Tolerating Vitamin D well. Will recheck in 3 months at next appointment.  If Vitamin D normal at next appointment, we can start bisphosphonate for osteoporosis.      Tobacco use    Chronic.  She is continue to cut back and is now down to 3 cigarettes per day.  I congratulated her on this and encouraged cutting back even further.  Goal is complete cessation.  Follow up 3 months.       Mixed hyperlipidemia    Chronic.  Check nonfasting lipids today.  LDL goal less than 100, continue atorvastatin 20 mg daily for now.  Follow up 3 months.       Relevant Orders   Lipid panel   Elevated blood pressure reading without diagnosis of hypertension    BP elevated today in office.  I gave the patient a BP log and i've asked her to check her blood pressure over the next couple of weeks.  She should report readings to me in 2 weeks.  If they remain elevated above goal of less than 140/90, will start ARB.  Urine microalbumin level checked today; can also consider starting ARB if this is elevated for kidney protection.  Follow up 3 months.       Relevant Orders   BASIC METABOLIC PANEL WITH GFR   Microalbumin, urine   Other Visit Diagnoses     Screening for colon cancer       Relevant Orders   Ambulatory referral to Gastroenterology        Follow up plan: Return in about 3 months (around 10/03/2021) for osteoporosis, vitamin D.

## 2021-07-05 NOTE — Assessment & Plan Note (Signed)
Chronic.  Tolerating Vitamin D well. Will recheck in 3 months at next appointment.  If Vitamin D normal at next appointment, we can start bisphosphonate.

## 2021-07-05 NOTE — Assessment & Plan Note (Signed)
Chronic.  Tolerating Vitamin D well. Will recheck in 3 months at next appointment.  If Vitamin D normal at next appointment, we can start bisphosphonate for osteoporosis.

## 2021-07-05 NOTE — Assessment & Plan Note (Signed)
Chronic.  Check nonfasting lipids today.  LDL goal less than 100, continue atorvastatin 20 mg daily for now.  Follow up 3 months.

## 2021-07-05 NOTE — Assessment & Plan Note (Signed)
BP elevated today in office.  I gave the patient a BP log and i've asked her to check her blood pressure over the next couple of weeks.  She should report readings to me in 2 weeks.  If they remain elevated above goal of less than 140/90, will start ARB.  Urine microalbumin level checked today; can also consider starting ARB if this is elevated for kidney protection.  Follow up 3 months.

## 2021-07-06 ENCOUNTER — Encounter: Payer: Self-pay | Admitting: Internal Medicine

## 2021-07-06 ENCOUNTER — Other Ambulatory Visit: Payer: Self-pay

## 2021-07-06 LAB — BASIC METABOLIC PANEL WITH GFR
BUN: 12 mg/dL (ref 7–25)
CO2: 29 mmol/L (ref 20–32)
Calcium: 9.7 mg/dL (ref 8.6–10.4)
Chloride: 103 mmol/L (ref 98–110)
Creat: 0.84 mg/dL (ref 0.50–1.05)
Glucose, Bld: 95 mg/dL (ref 65–99)
Potassium: 3.7 mmol/L (ref 3.5–5.3)
Sodium: 141 mmol/L (ref 135–146)
eGFR: 76 mL/min/{1.73_m2} (ref >=60)

## 2021-07-06 LAB — MICROALBUMIN, URINE: Microalb, Ur: 1.3 mg/dL

## 2021-07-06 LAB — LIPID PANEL
Cholesterol: 136 mg/dL (ref <200)
HDL: 55 mg/dL (ref >=50)
LDL Cholesterol (Calc): 63 mg/dL (calc)
Non-HDL Cholesterol (Calc): 81 mg/dL (calc) (ref <130)
Total CHOL/HDL Ratio: 2.5 (calc) (ref <5.0)
Triglycerides: 92 mg/dL (ref <150)

## 2021-07-06 MED ORDER — LISINOPRIL 10 MG PO TABS: 10.0000 mg | ORAL_TABLET | Freq: Every day | ORAL | 1 refills | Status: DC

## 2021-07-07 ENCOUNTER — Other Ambulatory Visit: Payer: Self-pay | Admitting: Nurse Practitioner

## 2021-08-12 ENCOUNTER — Other Ambulatory Visit: Payer: Self-pay

## 2021-08-12 ENCOUNTER — Ambulatory Visit (INDEPENDENT_AMBULATORY_CARE_PROVIDER_SITE_OTHER): Payer: Medicare Other | Admitting: Nurse Practitioner

## 2021-08-12 ENCOUNTER — Encounter: Payer: Self-pay | Admitting: Nurse Practitioner

## 2021-08-12 VITALS — BP 162/78 | HR 68 | Ht 65.0 in | Wt 160.0 lb

## 2021-08-12 DIAGNOSIS — I10 Essential (primary) hypertension: Secondary | ICD-10-CM

## 2021-08-12 DIAGNOSIS — Z720 Tobacco use: Secondary | ICD-10-CM | POA: Diagnosis not present

## 2021-08-12 LAB — BASIC METABOLIC PANEL WITH GFR
BUN: 8 mg/dL (ref 7–25)
CO2: 29 mmol/L (ref 20–32)
Calcium: 9.2 mg/dL (ref 8.6–10.4)
Chloride: 105 mmol/L (ref 98–110)
Creat: 0.82 mg/dL (ref 0.50–1.05)
Glucose, Bld: 147 mg/dL — ABNORMAL HIGH (ref 65–99)
Potassium: 3.8 mmol/L (ref 3.5–5.3)
Sodium: 142 mmol/L (ref 135–146)
eGFR: 78 mL/min/{1.73_m2} (ref >=60)

## 2021-08-12 MED ORDER — LISINOPRIL-HYDROCHLOROTHIAZIDE 10-12.5 MG PO TABS: 1.0000 | ORAL_TABLET | Freq: Every day | ORAL | 1 refills | Status: DC

## 2021-08-12 NOTE — Progress Notes (Signed)
Subjective:    Patient ID: Jennifer Armstrong, female    DOB: [birth year], 71 y.o.   MRN: [id number]  HPI: Jennifer Armstrong is a 71 y.o. female presenting for blood presure follow up.  Chief Complaint  Patient presents with   Hypertension   HYPERTENSION Hypertension status: uncontrolled  BP monitoring frequency:  not checking BP range: does not recall Medication compliance: excellent compliance Aspirin: no Recurrent headaches: no Visual changes: no Palpitations: no Dyspnea: no Chest pain: no Lower extremity edema: no Dizzy/lightheaded: no  Reports she had stopped smoking for 3 weeks and decided to smoke a cigarette on the way to the office today.  She is motivated to try to quit smoking.    No Known Allergies  Outpatient Encounter Medications as of 08/12/2021  Medication Sig   atorvastatin (LIPITOR) 20 MG tablet Take 1 tablet (20 mg total) by mouth daily.   lisinopril-hydrochlorothiazide (ZESTORETIC) 10-12.5 MG tablet Take 1 tablet by mouth daily.   [DISCONTINUED] lisinopril (ZESTRIL) 10 MG tablet Take 1 tablet (10 mg total) by mouth daily.   No facility-administered encounter medications on file as of 08/12/2021.    Patient Active Problem List   Diagnosis Date Noted   Vitamin D deficiency 07/05/2021   Age-related osteoporosis without current pathological fracture 04/08/2021   Mixed hyperlipidemia 04/08/2021   CKD (chronic kidney disease), stage II 04/08/2021   Murmur, cardiac 03/08/2021   Essential hypertension 03/08/2021   Tobacco use 03/08/2021   Tibial plateau fracture 02/09/2011   S/p tibial fracture 02/09/2011    Past Medical History:  Diagnosis Date   Knee pain, right    Tibial plateau fracture     Relevant past medical, surgical, family and social history reviewed and updated as indicated. Interim medical history since our last visit reviewed.  Review of Systems Per HPI unless specifically indicated above     Objective:    BP (!) 162/78     Pulse 68    Ht 5\' 5"  (1.651 m)    Wt 160 lb (72.6 kg)    SpO2 98%    BMI 26.63 kg/m   Wt Readings from Last 3 Encounters:  08/12/21 160 lb (72.6 kg)  07/05/21 164 lb (74.4 kg)  04/05/21 166 lb 6.4 oz (75.5 kg)    Physical Exam Vitals and nursing note reviewed.  Constitutional:      General: She is not in acute distress.    Appearance: Normal appearance. She is obese. She is not toxic-appearing.  HENT:     Head: Normocephalic and atraumatic.  Eyes:     General: No scleral icterus.    Extraocular Movements: Extraocular movements intact.  Cardiovascular:     Rate and Rhythm: Normal rate and regular rhythm.     Heart sounds: Murmur heard.  Pulmonary:     Effort: Pulmonary effort is normal. No respiratory distress.     Breath sounds: Normal breath sounds. No wheezing, rhonchi or rales.  Skin:    General: Skin is warm and dry.     Capillary Refill: Capillary refill takes less than 2 seconds.     Coloration: Skin is not jaundiced or pale.     Findings: No erythema.  Neurological:     Mental Status: She is alert and oriented to person, place, and time.     Motor: No weakness.     Gait: Gait normal.  Psychiatric:        Mood and Affect: Mood normal.  Behavior: Behavior normal.        Thought Content: Thought content normal.        Judgment: Judgment normal.       Assessment & Plan:   Problem List Items Addressed This Visit       Cardiovascular and Mediastinum   Essential hypertension - Primary    Chronic.  Patient reports she has been checking her BP at home but does not have log with her today.  Her BP remains elevated in our office.  Plan to add HCTZ 12.5 mg to lisinopril 10 mg daily.  Check kidney function today.  I encouraged her to continue checking BP at home and notify if less than 110/70 and/or symptoms of hypotension.  Follow up with new PCP in ~ 1 month.      Relevant Medications   lisinopril-hydrochlorothiazide (ZESTORETIC) 10-12.5 MG tablet   Other  Relevant Orders   BASIC METABOLIC PANEL WITH GFR     Other   Tobacco use    Encouraged complete cessation today.        Follow up plan: Return for with new PCP.

## 2021-08-12 NOTE — Assessment & Plan Note (Signed)
Chronic.  Patient reports she has been checking her BP at home but does not have log with her today.  Her BP remains elevated in our office.  Plan to add HCTZ 12.5 mg to lisinopril 10 mg daily.  Check kidney function today.  I encouraged her to continue checking BP at home and notify if less than 110/70 and/or symptoms of hypotension.  Follow up with new PCP in ~ 1 month.

## 2021-08-12 NOTE — Assessment & Plan Note (Signed)
Encouraged complete cessation today.

## 2021-08-13 ENCOUNTER — Emergency Department (HOSPITAL_COMMUNITY)
Admission: EM | Admit: 2021-08-13 | Discharge: 2021-08-13 | Disposition: A | Discharge status: Home / Self Care | Payer: Medicare Other | Attending: Emergency Medicine | Admitting: Emergency Medicine

## 2021-08-13 ENCOUNTER — Telehealth: Payer: Self-pay

## 2021-08-13 ENCOUNTER — Encounter (HOSPITAL_COMMUNITY): Payer: Self-pay | Admitting: *Deleted

## 2021-08-13 DIAGNOSIS — K0889 Other specified disorders of teeth and supporting structures: Secondary | ICD-10-CM | POA: Diagnosis not present

## 2021-08-13 DIAGNOSIS — I10 Essential (primary) hypertension: Secondary | ICD-10-CM | POA: Insufficient documentation

## 2021-08-13 DIAGNOSIS — K029 Dental caries, unspecified: Secondary | ICD-10-CM | POA: Diagnosis not present

## 2021-08-13 MED ORDER — AMOXICILLIN-POT CLAVULANATE 875-125 MG PO TABS: 1.0000 | ORAL_TABLET | Freq: Two times a day (BID) | ORAL | 0 refills | Status: DC

## 2021-08-13 MED ORDER — ACETAMINOPHEN 500 MG PO TABS
1000.0000 mg | ORAL_TABLET | Freq: Four times a day (QID) | ORAL | Status: DC | PRN
  Administered 2021-08-13: 1000 mg via ORAL
  Filled 2021-08-13: qty 2

## 2021-08-13 NOTE — Telephone Encounter (Signed)
Patient aware of results.

## 2021-08-13 NOTE — Telephone Encounter (Signed)
Attempted to reach patient regarding results.  No answer and unable to leave message.

## 2021-08-13 NOTE — ED Notes (Signed)
Pt d/c home with visitor per MD order. Discharge summary reviewed, verbalize understanding. Ambulatory off unit. No s/s of acute distress noted at discharge.  

## 2021-08-13 NOTE — Telephone Encounter (Signed)
-----   Message from Valentino Nose, NP sent at 08/13/2021  7:48 AM EST ----- Please notify with labs.  Kidney function and electrolytes are stable.

## 2021-08-13 NOTE — ED Triage Notes (Signed)
Dental pain. 

## 2021-08-13 NOTE — ED Provider Notes (Addendum)
[facility] EMERGENCY DEPARTMENT Provider Note   CSN: [id number] Arrival date & time: 08/13/21  1157     History  Chief Complaint  Patient presents with   Dental Pain    Jennifer Armstrong is a 71 y.o. female with medical history of hypertension.  Patient states that for the last 2 days she has had lower left-sided dental pain.  Patient states that the pain is constant in nature and when asked to describe the pain she states that it "just hurts".  Patient has attempted to alleviate the pain utilizing ibuprofen and to take medication however the pain always returns and the medication never seems to fully address the pain.  Patient states that she has not seen a dentist in "quite some time, a number of years".  Patient states that she has a history of root canals and crowns.  Patient endorses fevers and mouth pain.  Patient denies nausea, vomiting, diarrhea, abdominal pain.   Dental Pain Associated symptoms: fever       Home Medications Prior to Admission medications   Medication Sig Start Date End Date Taking? Authorizing Provider  amoxicillin-clavulanate (AUGMENTIN) 875-125 MG tablet Take 1 tablet by mouth every 12 (twelve) hours. 08/13/21  Yes Al Decant, PA-C  atorvastatin (LIPITOR) 20 MG tablet Take 1 tablet (20 mg total) by mouth daily. 03/10/21   Valentino Nose, NP  lisinopril-hydrochlorothiazide (ZESTORETIC) 10-12.5 MG tablet Take 1 tablet by mouth daily. 08/12/21   Valentino Nose, NP      Allergies    Patient has no known allergies.    Review of Systems   Review of Systems  Constitutional:  Positive for fever.  HENT:  Positive for dental problem.   Respiratory:  Negative for shortness of breath.   Cardiovascular:  Negative for chest pain.  Gastrointestinal:  Negative for abdominal pain, diarrhea, nausea and vomiting.   Physical Exam Updated Vital Signs BP (!) 156/107    Pulse 96    Temp (!) 100.9 F (38.3 C) (Oral)    Resp 20    SpO2 95%  Physical  Exam Vitals and nursing note reviewed.  Constitutional:      General: She is not in acute distress.    Appearance: Normal appearance. She is not ill-appearing, toxic-appearing or diaphoretic.  HENT:     Head: Normocephalic.     Nose: Nose normal.     Mouth/Throat:     Mouth: Mucous membranes are moist.     Comments: Patient first molar has obvious cavity, possibly from filling falling out.  There is no drainage, pus.  There is surrounding erythema to the tooth. Eyes:     Extraocular Movements: Extraocular movements intact.     Pupils: Pupils are equal, round, and reactive to light.  Cardiovascular:     Rate and Rhythm: Normal rate and regular rhythm.  Pulmonary:     Effort: Pulmonary effort is normal.     Breath sounds: Normal breath sounds. No wheezing.  Abdominal:     General: Abdomen is flat.     Palpations: Abdomen is soft.     Tenderness: There is no abdominal tenderness.  Musculoskeletal:     Cervical back: Normal range of motion and neck supple. No tenderness.  Skin:    General: Skin is warm and dry.     Capillary Refill: Capillary refill takes less than 2 seconds.  Neurological:     General: No focal deficit present.     Mental Status: She is alert.  ED Results / Procedures / Treatments   Labs (all labs ordered are listed, but only abnormal results are displayed) Labs Reviewed - No data to display  EKG None  Radiology No results found.  Procedures Procedures    Medications Ordered in ED Medications  acetaminophen (TYLENOL) tablet 1,000 mg (has no administration in time range)    ED Course/ Medical Decision Making/ A&P                           Medical Decision Making Risk OTC drugs. Prescription drug management.   71 year old female with history of hypertension presents due to dental pain for the last 2 days.  Patient reports she has not seen dentist in quite some time.  On examination, patient is febrile, not hypoxic, nontoxic-appearing,  handling secretions appropriately, midline uvula.  There are no red flag symptoms.  There are no signs of PTA or RPA.  Patient does have obvious cavity to the first molar on the right lower side consistent with where patient states the pain originates from.  Of note, this patient is hypertensive at 156/107.  On chart review, the patient does have a history of hypertension for which she is currently being treated as an outpatient by her primary care doctor for.  Primary care doctor's note states that the patient continues to be hypertensive even with home medication administration.  I do not see a need to address patient's hypertension here today as she is already being seen for this complaint and was recently seen for it as recently as yesterday.  Patient denies headache, blurred vision, chest pain, shortness of breath.  I will treat the patient's fever with 1000 mg of Tylenol.  I will also prescribe this patient Augmentin 875 mg twice a day for 7 days.  I have explained to the patient that she needs to see dentist and I will refer her to one here today.  I have advised the patient to take her antibiotic and to treat any future fevers with Tylenol.  I have provided patient work with return precautions which she voices understanding with.  I have referred the patient to a dentist.  I have discussed the patient and her case with Dr. Deretha Emory who is in agreement with the plan for outpatient discharge and follow-up with dentistry.  The patient had all of her questions answered to her satisfaction.  The patient is stable on discharge.   Final Clinical Impression(s) / ED Diagnoses Final diagnoses:  Pain, dental    Rx / DC Orders ED Discharge Orders          Ordered    amoxicillin-clavulanate (AUGMENTIN) 875-125 MG tablet  Every 12 hours        08/13/21 1357               Al Decant, PA-C 08/13/21 1400    Vanetta Mulders, MD 08/15/21 [phone]

## 2021-08-13 NOTE — Discharge Instructions (Addendum)
Return to ED for any new or worsening symptoms such as nausea, vomiting, diarrhea, fevers nonresponsive to Tylenol Take 1000 mg Tylenol every 6 hours for fever Please take the antibiotic I prescribed you as directed Please call the dentist I referred you to make an appointment as soon as possible

## 2021-08-13 NOTE — Telephone Encounter (Signed)
This encounter was created in error - please disregard.

## 2021-08-25 ENCOUNTER — Ambulatory Visit (INDEPENDENT_AMBULATORY_CARE_PROVIDER_SITE_OTHER): Payer: Self-pay | Admitting: *Deleted

## 2021-08-25 ENCOUNTER — Other Ambulatory Visit: Payer: Self-pay

## 2021-08-25 VITALS — Ht 65.0 in | Wt 176.0 lb

## 2021-08-25 DIAGNOSIS — Z1211 Encounter for screening for malignant neoplasm of colon: Secondary | ICD-10-CM

## 2021-08-25 NOTE — Progress Notes (Signed)
Gastroenterology Pre-Procedure Review  Request Date: 08/25/2021 Requesting Physician: Cathlean Marseilles, NP @ [facility], no previous TCS, family hx of colon cancer (mother)  PATIENT REVIEW QUESTIONS: The patient responded to the following health history questions as indicated:    1. Diabetes Melitis: no 2. Joint replacements in the past 12 months: no 3. Major health problems in the past 3 months: no 4. Has an artificial valve or MVP: no 5. Has a defibrillator: no 6. Has been advised in past to take antibiotics in advance of a procedure like teeth cleaning: no 7. Family history of colon cancer: yes, mother: age 75's  8. Alcohol Use: no 9. Illicit drug Use: no 10. History of sleep apnea: no  11. History of coronary artery or other vascular stents placed within the last 12 months: no 12. History of any prior anesthesia complications: no 13. Body mass index is 29.29 kg/m.    MEDICATIONS & ALLERGIES:    Patient reports the following regarding taking any blood thinners:   Plavix? no Aspirin? no Coumadin? no Brilinta? no Xarelto? no Eliquis? no Pradaxa? no Savaysa? no Effient? no  Patient confirms/reports the following medications:  Current Outpatient Medications  Medication Sig Dispense Refill   amoxicillin-clavulanate (AUGMENTIN) 875-125 MG tablet Take 1 tablet by mouth every 12 (twelve) hours. 14 tablet 0   atorvastatin (LIPITOR) 20 MG tablet Take 1 tablet (20 mg total) by mouth daily. 90 tablet 3   lisinopril-hydrochlorothiazide (ZESTORETIC) 10-12.5 MG tablet Take 1 tablet by mouth daily. 90 tablet 1   No current facility-administered medications for this visit.    Patient confirms/reports the following allergies:  No Known Allergies  No orders of the defined types were placed in this encounter.   AUTHORIZATION INFORMATION Primary Insurance: [employer] Medicare,  ID #: [id number],  Group #: [id number] Pre-Cert / Berkley Harvey required:  Pre-Cert / Auth #:   SCHEDULE  INFORMATION: Procedure has been scheduled as follows:  Date: , Time:   Location: [facility] with Dr. Marletta Lor  This Gastroenterology Pre-Precedure Review Form is being routed to the following provider(s): Ermalinda Memos, PA-C

## 2021-08-26 NOTE — Progress Notes (Signed)
Okay to schedule.  ASA 3.  BMP at preop.

## 2021-08-27 ENCOUNTER — Ambulatory Visit (INDEPENDENT_AMBULATORY_CARE_PROVIDER_SITE_OTHER): Payer: Medicare Other | Admitting: Nurse Practitioner

## 2021-08-27 ENCOUNTER — Encounter: Payer: Self-pay | Admitting: Nurse Practitioner

## 2021-08-27 VITALS — BP 143/74 | HR 58 | Temp 98.4°F | Ht 65.0 in | Wt 160.0 lb

## 2021-08-27 DIAGNOSIS — I10 Essential (primary) hypertension: Secondary | ICD-10-CM

## 2021-08-27 DIAGNOSIS — Z7689 Persons encountering health services in other specified circumstances: Secondary | ICD-10-CM | POA: Insufficient documentation

## 2021-08-27 DIAGNOSIS — E782 Mixed hyperlipidemia: Secondary | ICD-10-CM | POA: Diagnosis not present

## 2021-08-27 LAB — CBC WITH DIFFERENTIAL/PLATELET
Basophils Absolute: 0 10*3/uL (ref 0.0–0.2)
Basos: 0 %
EOS (ABSOLUTE): 0.2 10*3/uL (ref 0.0–0.4)
Eos: 3 %
Hematocrit: 35.2 % (ref 34.0–46.6)
Hemoglobin: 11.7 g/dL (ref 11.1–15.9)
Immature Grans (Abs): 0 10*3/uL (ref 0.0–0.1)
Immature Granulocytes: 0 %
Lymphocytes Absolute: 1.7 10*3/uL (ref 0.7–3.1)
Lymphs: 35 %
MCH: 31.3 pg (ref 26.6–33.0)
MCHC: 33.2 g/dL (ref 31.5–35.7)
MCV: 94 fL (ref 79–97)
Monocytes Absolute: 0.4 10*3/uL (ref 0.1–0.9)
Monocytes: 8 %
Neutrophils Absolute: 2.6 10*3/uL (ref 1.4–7.0)
Neutrophils: 54 %
Platelets: 247 10*3/uL (ref 150–450)
RBC: 3.74 x10E6/uL — ABNORMAL LOW (ref 3.77–5.28)
RDW: 11.9 % (ref 11.7–15.4)
WBC: 4.9 10*3/uL (ref 3.4–10.8)

## 2021-08-27 LAB — LIPID PANEL
Chol/HDL Ratio: 3.3 ratio (ref 0.0–4.4)
Cholesterol, Total: 146 mg/dL (ref 100–199)
HDL: 44 mg/dL (ref >39)
LDL Chol Calc (NIH): 84 mg/dL (ref 0–99)
Triglycerides: 99 mg/dL (ref 0–149)
VLDL Cholesterol Cal: 18 mg/dL (ref 5–40)

## 2021-08-27 NOTE — Progress Notes (Signed)
New Patient Note  RE: Jennifer Armstrong MRN: [id number] DOB: 1951/05/11 Date of Office Visit: 08/27/2021  Chief Complaint: New Patient (Initial Visit) (B/P management)  History of Present Illness:  Hypertension: Patient here for follow-up of elevated blood pressure. She is not exercising and is not adherent to low salt diet.  Blood pressure is not well controlled at home. Cardiac symptoms none. Patient denies chest pain, chest pressure/discomfort, claudication, dyspnea, fatigue, irregular heart beat, lower extremity edema, and near-syncope.  Cardiovascular risk factors: advanced age (older than 29 for men, 33 for women) and hypertension. Use of agents associated with hypertension: none. History of target organ damage: none.   Smoking cessation instruction/counseling given:  counseled patient on the dangers of tobacco use, advised patient to stop smoking, and reviewed strategies to maximize success   Assessment and Plan: [name] is a 71 y.o. female with: Essential hypertension Patient is establishing care and following up after starting HCTZ with lisinopril. Blood pressure is still not controlled, patient did not bring a blood pressure log and did not have a good history of blood pressure reading outside of the clinic. We spent an enormous amount of time educating patient on diet, exercise, decrease sodium in diet, and smoking cessation. Patient was accompanied by daughter who acknowledges that patient eats a lot of french fries.   Patient will continue on current mediation dose as blood pressure is affected by patient smoking right before visit, we will follow up in two weeks, advised patient to keep a daily blood pressure log twice daily for one week.   Completed CBC and CMP results pending.  Mixed hyperlipidemia Patient will follow up in 3 months  Encounter to establish care Patient is establishing care today, completed assessment, provided education on preventative care and health care  maintenance. Patient will return for complete physical and labs. PAP completed by OBGYN   Return in about 3 months (around 11/24/2021).   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 90 tablet 3   lisinopril-hydrochlorothiazide (ZESTORETIC) 10-12.5 MG tablet Take 1 tablet by mouth daily. 90 tablet 1   No current facility-administered medications for this visit.   Allergies: No Known Allergies Social History: Social History   Socioeconomic History   Marital status: Single    Spouse name: Not on file   Number of children: Not on file   Years of education: Not on file   Highest education level: Not on file  Occupational History   Not on file  Tobacco Use   Smoking status: Every Day    Packs/day: 0.50    Types: Cigarettes    Start  date: 07/25/1977   Smokeless tobacco: Never  Vaping Use   Vaping Use: Never used  Substance and Sexual Activity   Alcohol use: Not Currently   Drug use: Never   Sexual activity: Yes  Other Topics Concern   Not on file  Social History Narrative   Not on file   Social Determinants of Health    Financial Resource Strain: Not on file  Food Insecurity: Not on file  Transportation Needs: Not on file  Physical Activity: Not on file  Stress: Not on file  Social Connections: Not on file       Family History: Family History  Problem Relation Age of Onset   Cancer Mother    Heart attack Father    Sickle cell trait Son    Sickle cell trait Son    Sickle cell anemia Daughter          Review of Systems  Constitutional: Negative.   HENT: Negative.    Eyes: Negative.   Respiratory: Negative.    Cardiovascular: Negative.   Gastrointestinal: Negative.   Musculoskeletal: Negative.   Skin:  Negative for rash.  Psychiatric/Behavioral: Negative.    All other systems reviewed and are negative. Objective: BP (!) 143/74    Pulse (!) 58    Temp 98.4 F (36.9 C)    Ht 5\' 5"  (1.651 m)    Wt 160 lb (72.6 kg)    SpO2 98%    BMI 26.63 kg/m  Body mass index is 26.63 kg/m. Physical Exam Vitals and nursing note reviewed. Exam conducted with a chaperone present (daughter).  HENT:     Head: Normocephalic.     Right Ear: Ear canal and external ear normal.     Left Ear: Ear canal and external ear normal.     Mouth/Throat:     Pharynx: Oropharynx is clear.  Eyes:     Conjunctiva/sclera: Conjunctivae normal.  Cardiovascular:     Rate and Rhythm: Normal rate and regular rhythm.     Pulses: Normal pulses.     Heart sounds: Normal heart sounds.  Pulmonary:     Effort: Pulmonary effort is normal.     Breath sounds: Normal breath sounds.  Abdominal:     General: Bowel sounds are normal.  Skin:    General: Skin is warm.     Findings: No rash.  Neurological:     Mental Status: She is alert and oriented to person, place, and time.   The plan was reviewed with the patient/family, and all questions/concerned were addressed.  It was my pleasure to see Jennifer Armstrong today and participate in her care. Please feel free to contact me with any questions or concerns.  Sincerely,  Jac Canavan  NP [place]

## 2021-08-27 NOTE — Progress Notes (Signed)
Left message with brother for pt to call us back.

## 2021-08-27 NOTE — Patient Instructions (Signed)
High Cholesterol High cholesterol is a condition in which the blood has high levels of a white, waxy substance similar to fat (cholesterol). The liver makes all the cholesterol that the body needs. The human body needs small amounts of cholesterol to help build cells. A person gets extra or excess cholesterol from the food that he or she eats. The blood carries cholesterol from the liver to the rest of the body. If you have high cholesterol, deposits (plaques) may build up on the walls of your arteries. Arteries are the blood vessels that carry blood away from your heart. These plaques make the arteries narrow and stiff. Cholesterol plaques increase your risk for heart attack and stroke. Work with your health care provider to keep your cholesterol levels in a healthy range. What increases the risk? The following factors may make you more likely to develop this condition: Eating foods that are high in animal fat (saturated fat) or cholesterol. Being overweight. Not getting enough exercise. A family history of high cholesterol (familial hypercholesterolemia). Use of tobacco products. Having diabetes. What are the signs or symptoms? In most cases, high cholesterol does not usually cause any symptoms. In severe cases, very high cholesterol levels can cause: Fatty bumps under the skin (xanthomas). A white or gray ring around the black center (pupil) of the eye. How is this diagnosed? This condition may be diagnosed based on the results of a blood test. If you are older than 69 years of age, your health care provider may check your cholesterol levels every 4-6 years. You may be checked more often if you have high cholesterol or other risk factors for heart disease. The blood test for cholesterol measures: "Bad" cholesterol, or LDL cholesterol. This is the main type of cholesterol that causes heart disease. The desired level is less than 100 mg/dL (2.59 mmol/L). "Good" cholesterol, or HDL  cholesterol. HDL helps protect against heart disease by cleaning the arteries and carrying the LDL to the liver for processing. The desired level for HDL is 60 mg/dL (1.55 mmol/L) or higher. Triglycerides. These are fats that your body can store or burn for energy. The desired level is less than 150 mg/dL (1.69 mmol/L). Total cholesterol. This measures the total amount of cholesterol in your blood and includes LDL, HDL, and triglycerides. The desired level is less than 200 mg/dL (5.17 mmol/L). How is this treated? Treatment for high cholesterol starts with lifestyle changes, such as diet and exercise. Diet changes. You may be asked to eat foods that have more fiber and less saturated fats or added sugar. Lifestyle changes. These may include regular exercise, maintaining a healthy weight, and quitting use of tobacco products. Medicines. These are given when diet and lifestyle changes have not worked. You may be prescribed a statin medicine to help lower your cholesterol levels. Follow these instructions at home: Eating and drinking  Eat a healthy, balanced diet. This diet includes: Daily servings of a variety of fresh, frozen, or canned fruits and vegetables. Daily servings of whole grain foods that are rich in fiber. Foods that are low in saturated fats and trans fats. These include poultry and fish without skin, lean cuts of meat, and low-fat dairy products. A variety of fish, especially oily fish that contain omega-3 fatty acids. Aim to eat fish at least 2 times a week. Avoid foods and drinks that have added sugar. Use healthy cooking methods, such as roasting, grilling, broiling, baking, poaching, steaming, and stir-frying. Do not fry your food except for stir-frying.   If you drink alcohol: Limit how much you have to: 0-1 drink a day for women who are not pregnant. 0-2 drinks a day for men. Know how much alcohol is in a drink. In the [place] one drink equals one 12 oz bottle of beer (355 mL),  one 5 oz glass of wine (148 mL), or one 1 oz glass of hard liquor (44 mL). Lifestyle  Get regular exercise. Aim to exercise for a total of 150 minutes a week. Increase your activity level by doing activities such as gardening, walking, and taking the stairs. Do not use any products that contain nicotine or tobacco. These products include cigarettes, chewing tobacco, and vaping devices, such as e-cigarettes. If you need help quitting, ask your health care provider. General instructions Take over-the-counter and prescription medicines only as told by your health care provider. Keep all follow-up visits. This is important. Where to find more information American Heart Association: www.heart.org National Heart, Lung, and Blood Institute: www.nhlbi.nih.gov Contact a health care provider if: You have trouble achieving or maintaining a healthy diet or weight. You are starting an exercise program. You are unable to stop smoking. Get help right away if: You have chest pain. You have trouble breathing. You have discomfort or pain in your jaw, neck, back, shoulder, or arm. You have any symptoms of a stroke. "BE FAST" is an easy way to remember the main warning signs of a stroke: B - Balance. Signs are dizziness, sudden trouble walking, or loss of balance. E - Eyes. Signs are trouble seeing or a sudden change in vision. F - Face. Signs are sudden weakness or numbness of the face, or the face or eyelid drooping on one side. A - Arms. Signs are weakness or numbness in an arm. This happens suddenly and usually on one side of the body. S - Speech. Signs are sudden trouble speaking, slurred speech, or trouble understanding what people say. T - Time. Time to call emergency services. Write down what time symptoms started. You have other signs of a stroke, such as: A sudden, severe headache with no known cause. Nausea or vomiting. Seizure. These symptoms may represent a serious problem that is an  emergency. Do not wait to see if the symptoms will go away. Get medical help right away. Call your local emergency services (911 in the [place] Do not drive yourself to the hospital. Summary Cholesterol plaques increase your risk for heart attack and stroke. Work with your health care provider to keep your cholesterol levels in a healthy range. Eat a healthy, balanced diet, get regular exercise, and maintain a healthy weight. Do not use any products that contain nicotine or tobacco. These products include cigarettes, chewing tobacco, and vaping devices, such as e-cigarettes. Get help right away if you have any symptoms of a stroke. This information is not intended to replace advice given to you by your health care provider. Make sure you discuss any questions you have with your health care provider. Document Revised: 09/24/2020 Document Reviewed: 09/14/2020 Elsevier Patient Education  2022 Else[employer] Inc. Hypertension, Adult Hypertension is another name for high blood pressure. High blood pressure forces your heart to work harder to pump blood. This can cause problems over time. There are two numbers in a blood pressure reading. There is a top number (systolic) over a bottom number (diastolic). It is best to have a blood pressure that is below 120/80. Healthy choices can help lower your blood pressure, or you may need medicine to help lower   it. What are the causes? The cause of this condition is not known. Some conditions may be related to high blood pressure. What increases the risk? Smoking. Having type 2 diabetes mellitus, high cholesterol, or both. Not getting enough exercise or physical activity. Being overweight. Having too much fat, sugar, calories, or salt (sodium) in your diet. Drinking too much alcohol. Having long-term (chronic) kidney disease. Having a family history of high blood pressure. Age. Risk increases with age. Race. You may be at higher risk if you are African  American. Gender. Men are at higher risk than women before age 45. After age 65, women are at higher risk than men. Having obstructive sleep apnea. Stress. What are the signs or symptoms? High blood pressure may not cause symptoms. Very high blood pressure (hypertensive crisis) may cause: Headache. Feelings of worry or nervousness (anxiety). Shortness of breath. Nosebleed. A feeling of being sick to your stomach (nausea). Throwing up (vomiting). Changes in how you see. Very bad chest pain. Seizures. How is this treated? This condition is treated by making healthy lifestyle changes, such as: Eating healthy foods. Exercising more. Drinking less alcohol. Your health care provider may prescribe medicine if lifestyle changes are not enough to get your blood pressure under control, and if: Your top number is above 130. Your bottom number is above 80. Your personal target blood pressure may vary. Follow these instructions at home: Eating and drinking  If told, follow the DASH eating plan. To follow this plan: Fill one half of your plate at each meal with fruits and vegetables. Fill one fourth of your plate at each meal with whole grains. Whole grains include whole-wheat pasta, brown rice, and whole-grain bread. Eat or drink low-fat dairy products, such as skim milk or low-fat yogurt. Fill one fourth of your plate at each meal with low-fat (lean) proteins. Low-fat proteins include fish, chicken without skin, eggs, beans, and tofu. Avoid fatty meat, cured and processed meat, or chicken with skin. Avoid pre-made or processed food. Eat less than 1,500 mg of salt each day. Do not drink alcohol if: Your doctor tells you not to drink. You are pregnant, may be pregnant, or are planning to become pregnant. If you drink alcohol: Limit how much you use to: 0-1 drink a day for women. 0-2 drinks a day for men. Be aware of how much alcohol is in your drink. In the [place] one drink equals one 12  oz bottle of beer (355 mL), one 5 oz glass of wine (148 mL), or one 1 oz glass of hard liquor (44 mL). Lifestyle  Work with your doctor to stay at a healthy weight or to lose weight. Ask your doctor what the best weight is for you. Get at least 30 minutes of exercise most days of the week. This may include walking, swimming, or biking. Get at least 30 minutes of exercise that strengthens your muscles (resistance exercise) at least 3 days a week. This may include lifting weights or doing Pilates. Do not use any products that contain nicotine or tobacco, such as cigarettes, e-cigarettes, and chewing tobacco. If you need help quitting, ask your doctor. Check your blood pressure at home as told by your doctor. Keep all follow-up visits as told by your doctor. This is important. Medicines Take over-the-counter and prescription medicines only as told by your doctor. Follow directions carefully. Do not skip doses of blood pressure medicine. The medicine does not work as well if you skip doses. Skipping doses also puts   you at risk for problems. Ask your doctor about side effects or reactions to medicines that you should watch for. Contact a doctor if you: Think you are having a reaction to the medicine you are taking. Have headaches that keep coming back (recurring). Feel dizzy. Have swelling in your ankles. Have trouble with your vision. Get help right away if you: Get a very bad headache. Start to feel mixed up (confused). Feel weak or numb. Feel faint. Have very bad pain in your: Chest. Belly (abdomen). Throw up more than once. Have trouble breathing. Summary Hypertension is another name for high blood pressure. High blood pressure forces your heart to work harder to pump blood. For most people, a normal blood pressure is less than 120/80. Making healthy choices can help lower blood pressure. If your blood pressure does not get lower with healthy choices, you may need to take  medicine. This information is not intended to replace advice given to you by your health care provider. Make sure you discuss any questions you have with your health care provider. Document Revised: 03/21/2018 Document Reviewed: 03/21/2018 Elsevier Patient Education  2022 Elsevier Inc.  

## 2021-08-28 NOTE — Assessment & Plan Note (Signed)
Patient will follow up in 3 months

## 2021-08-28 NOTE — Assessment & Plan Note (Addendum)
Patient is establishing care today, completed assessment, provided education on preventative care and health care maintenance. Patient will return for complete physical and labs. PAP completed by Harle Battiest

## 2021-08-28 NOTE — Assessment & Plan Note (Signed)
Patient is establishing care and following up after starting HCTZ with lisinopril. Blood pressure is still not controlled, patient did not bring a blood pressure log and did not have a good history of blood pressure reading outside of the clinic. We spent an enormous amount of time educating patient on diet, exercise, decrease sodium in diet, and smoking cessation. Patient was accompanied by daughter who acknowledges that patient eats a lot of french fries.   Patient will continue on current mediation dose as blood pressure is affected by patient smoking right before visit, we will follow up in two weeks, advised patient to keep a daily blood pressure log twice daily for one week.   Completed CBC and CMP results pending.

## 2021-08-30 ENCOUNTER — Encounter: Payer: Self-pay | Admitting: *Deleted

## 2021-08-30 NOTE — Progress Notes (Signed)
Tried to call pt.  Voice mail full.  Mailed letter to pt.

## 2021-10-04 ENCOUNTER — Ambulatory Visit: Payer: Medicare Other | Admitting: Nurse Practitioner

## 2021-11-12 ENCOUNTER — Ambulatory Visit (INDEPENDENT_AMBULATORY_CARE_PROVIDER_SITE_OTHER): Payer: Medicare Other | Admitting: Nurse Practitioner

## 2021-11-12 ENCOUNTER — Encounter: Payer: Self-pay | Admitting: Nurse Practitioner

## 2021-11-12 VITALS — BP 142/68 | HR 64 | Ht 65.0 in | Wt 150.0 lb

## 2021-11-12 DIAGNOSIS — I10 Essential (primary) hypertension: Secondary | ICD-10-CM | POA: Diagnosis not present

## 2021-11-12 DIAGNOSIS — Z720 Tobacco use: Secondary | ICD-10-CM | POA: Diagnosis not present

## 2021-11-12 DIAGNOSIS — E782 Mixed hyperlipidemia: Secondary | ICD-10-CM

## 2021-11-12 MED ORDER — LISINOPRIL-HYDROCHLOROTHIAZIDE 20-12.5 MG PO TABS: 1.0000 | ORAL_TABLET | Freq: Every day | ORAL | 3 refills | Status: DC

## 2021-11-12 NOTE — Assessment & Plan Note (Signed)
States that she has been smoking for over 50 years, recently cut back from 1 pack of cigarette daily to half pack of cigarettes daily ?Need to quit smoking including risk of lung cancer heart disease and MI discussed with patient, patient is not ready to quit patient encouraged to continue to work on quitting.  Smoking cessation materials given today. ?Plan to order low dose chest CT scan at her next annual exam.  ?

## 2021-11-12 NOTE — Progress Notes (Signed)
? ?New Patient Office Visit ? ?Subjective   ? ?Patient ID: Jennifer Armstrong, female    DOB: Nov 22, 1950  Age: 71 y.o. MRN: [id number] ? ?CC:  ?Chief Complaint  ?Patient presents with  ? New Patient (Initial Visit)  ?  NP  ? ? ?HPI ?Jennifer Armstrong with past medical history of hypertension, hyperlipidemia presents to establish Armstrong for her chronic medical conditions. Previous patient of  Lynnell Chad NP. ? ?Hypertension.  Takes lisinopril-hydrochlorothiazide 10-12.5 mg tablet daily, patient states that she has been taking medication as prescribed, denies chest pain dizziness headaches edema. ? ?Hyperlipidemia  On atorvastatin 20 mg daily, patient is a curren cigarette  smoker, she has been smoking since over 50 years, she was previously smoking 1 pack a day and has cut down to 0.5 pack daily.  Need to quit smoking including risk of lung cancer heart disease stroke discussed with patient she verbalized understanding.  Patient not ready to quit today smoking cessation occasional materials given.  Patient encouraged to continue to work on quitting.  ? ?Patient is due for shingles vaccine Tdap vaccine pneumonia vaccine colon cancer screening.  Patient encouraged to get our vaccines at the pharmacy need for all vaccines discussed with patient she verbalized understanding. ? ? ? ?Outpatient Encounter Medications as of 11/12/2021  ?Medication Sig  ? atorvastatin (LIPITOR) 20 MG tablet Take 1 tablet (20 mg total) by mouth daily.  ? lisinopril-hydrochlorothiazide (ZESTORETIC) 20-12.5 MG tablet Take 1 tablet by mouth daily.  ? [DISCONTINUED] lisinopril-hydrochlorothiazide (ZESTORETIC) 10-12.5 MG tablet Take 1 tablet by mouth daily.  ? ?No facility-administered encounter medications on file as of 11/12/2021.  ? ? ?Past Medical History:  ?Diagnosis Date  ? Hyperlipidemia   ? Hypertension   ? Knee pain, right   ? Tibial plateau fracture   ? ? ?Past Surgical History:  ?Procedure Laterality Date  ? ABDOMINAL HYSTERECTOMY    ? KNEE  SURGERY    ? ? ?Family History  ?Problem Relation Age of Onset  ? Cancer Mother   ? Heart attack Father   ? Sickle cell trait Son   ? Sickle cell trait Son   ? Sickle cell anemia Daughter   ? ? ?Social History  ? ?Socioeconomic History  ? Marital status: Single  ?  Spouse name: Not on file  ? Number of children: 2  ? Years of education: Not on file  ? Highest education level: Not on file  ?Occupational History  ? Not on file  ?Tobacco Use  ? Smoking status: Every Day  ?  Packs/day: 0.50  ?  Types: Cigarettes  ?  Start date: 07/25/1977  ? Smokeless tobacco: Never  ? Tobacco comments:  ?  Used to smoke almost a pack now cut back to 0.5pack a day , has been smoking for over 50 years.   ?Vaping Use  ? Vaping Use: Never used  ?Substance and Sexual Activity  ? Alcohol use: Not Currently  ? Drug use: Never  ? Sexual activity: Yes  ?Other Topics Concern  ? Not on file  ?Social History Narrative  ? Lives with her son .   ? ?Social Determinants of Health  ? ?Financial Resource Strain: Not on file  ?Food Insecurity: Not on file  ?Transportation Needs: Not on file  ?Physical Activity: Not on file  ?Stress: Not on file  ?Social Connections: Not on file  ?Intimate Partner Violence: Not on file  ? ? ?Review of Systems  ?Constitutional: Negative.   ?Respiratory:  Negative.    ?Cardiovascular: Negative.   ?Gastrointestinal: Negative.   ?Neurological: Negative.   ?Psychiatric/Behavioral: Negative.    ? ?  ? ? ?Objective   ? ?BP (!) 142/68   Pulse 64   Ht 5\' 5"  (1.651 m)   Wt 150 lb (68 kg)   SpO2 99%   BMI 24.96 kg/m?  ? ?Physical Exam ?Constitutional:   ?   General: She is not in acute distress. ?   Appearance: She is not ill-appearing or toxic-appearing.  ?Cardiovascular:  ?   Rate and Rhythm: Normal rate and regular rhythm.  ?   Pulses: Normal pulses.  ?   Heart sounds: Normal heart sounds. No murmur heard. ?  No friction rub. No gallop.  ?Pulmonary:  ?   Effort: Pulmonary effort is normal. No respiratory distress.  ?   Breath  sounds: Normal breath sounds. No stridor. No wheezing, rhonchi or rales.  ?Chest:  ?   Chest wall: No tenderness.  ?Abdominal:  ?   Palpations: Abdomen is soft.  ?Skin: ?   Capillary Refill: Capillary refill takes less than 2 seconds.  ?Neurological:  ?   Mental Status: She is alert and oriented to person, place, and time.  ?Psychiatric:     ?   Mood and Affect: Mood normal.     ?   Behavior: Behavior normal.     ?   Thought Content: Thought content normal.     ?   Judgment: Judgment normal.  ? ? ? ?  ? ?Assessment & Plan:  ? ?Problem List Items Addressed This Visit   ? ?  ? Cardiovascular and Mediastinum  ? Essential hypertension - Primary  ?  BP Readings from Last 3 Encounters:  ?11/12/21 (!) 150/70  ?08/27/21 (!) 143/74  ?08/13/21 (!) 150/82  ?Chronic condition not well controlled ?on lisinopril-hydrochlorothiazide 10-12.5mg  tablet daily.  Stop current medication ?Start lisinopril-hydrochlorothiazide 20- 12.5 mg tablet daily. ?BMP in 2 weeks ?DASH diet advised , encouraged to quit smoking exercise at least 150 minutes weekly ?Monitor blood pressure at home keep a log and bring to follw up visit in 4 weeks ?  ?  ? Relevant Medications  ? lisinopril-hydrochlorothiazide (ZESTORETIC) 20-12.5 MG tablet  ? Other Relevant Orders  ? Basic Metabolic Panel (BMET)  ?  ? Other  ? Tobacco use  ?  States that she has been smoking for over 50 years, recently cut back from 1 pack of cigarette daily to half pack of cigarettes daily ?Need to quit smoking including risk of lung cancer heart disease and MI discussed with patient, patient is not ready to quit patient encouraged to continue to work on quitting.  Smoking cessation materials given today. ?Plan to order low dose chest CT scan at her next annual exam.  ? ?  ?  ? Mixed hyperlipidemia  ?  Condition well-controlled on atorvastatin 20 mg daily ?Continue current medication ?Lab Results  ?Component Value Date  ? CHOL 146 08/27/2021  ? HDL 44 08/27/2021  ? LDLCALC 84 08/27/2021   ? TRIG 99 08/27/2021  ? CHOLHDL 3.3 08/27/2021  ? ?  ?  ? Relevant Medications  ? lisinopril-hydrochlorothiazide (ZESTORETIC) 20-12.5 MG tablet  ? ? ?Return in about 4 weeks (around 12/10/2021) for f/u for HTN.  ? ?12/12/2021, FNP ? ? ?

## 2021-11-12 NOTE — Patient Instructions (Addendum)
Please start taking lisinopril-hydrochlorothiazide (ZESTORETIC) 20-12.5 MG tablet , one tablet daily for your blood pressure.  ? ? ?Please get your TDAP vaccine, pneumonia vaccine and shingles vaccine at your pharmacy. ? ?It is important that you exercise regularly at least 30 minutes 5 times a week.  ?Think about what you will eat, plan ahead. ?Choose " clean, green, fresh or frozen" over canned, processed or packaged foods which are more sugary, salty and fatty. ?70 to 75% of food eaten should be vegetables and fruit. ?Three meals at set times with snacks allowed between meals, but they must be fruit or vegetables. ?Aim to eat over a 12 hour period , example 7 am to 7 pm, and STOP after  your last meal of the day. ?Drink water,generally about 64 ounces per day, no other drink is as healthy. Fruit juice is best enjoyed in a healthy way, by EATING the fruit. ? ?Thanks for choosing [facility], we consider it a privelige to serve you. ? ?

## 2021-11-12 NOTE — Assessment & Plan Note (Addendum)
BP Readings from Last 3 Encounters:  ?11/12/21 (!) 150/70  ?08/27/21 (!) 143/74  ?08/13/21 (!) 150/82  ?Chronic condition not well controlled ?on lisinopril-hydrochlorothiazide 10-12.5mg  tablet daily.  Stop current medication ?Start lisinopril-hydrochlorothiazide 20- 12.5 mg tablet daily. ?BMP in 2 weeks ?DASH diet advised , encouraged to quit smoking exercise at least 150 minutes weekly ?Monitor blood pressure at home keep a log and bring to follw up visit in 4 weeks ?

## 2021-11-12 NOTE — Assessment & Plan Note (Signed)
Condition well-controlled on atorvastatin 20 mg daily ?Continue current medication ?Lab Results  ?Component Value Date  ? CHOL 146 08/27/2021  ? HDL 44 08/27/2021  ? LDLCALC 84 08/27/2021  ? TRIG 99 08/27/2021  ? CHOLHDL 3.3 08/27/2021  ? ?

## 2021-11-29 ENCOUNTER — Ambulatory Visit: Payer: Medicare Other | Admitting: Nurse Practitioner

## 2021-11-30 ENCOUNTER — Encounter: Payer: Self-pay | Admitting: Nurse Practitioner

## 2021-12-03 DIAGNOSIS — I10 Essential (primary) hypertension: Secondary | ICD-10-CM | POA: Diagnosis not present

## 2021-12-04 LAB — BASIC METABOLIC PANEL
BUN/Creatinine Ratio: 9 — ABNORMAL LOW (ref 12–28)
BUN: 8 mg/dL (ref 8–27)
CO2: 23 mmol/L (ref 20–29)
Calcium: 8.9 mg/dL (ref 8.7–10.3)
Chloride: 108 mmol/L — ABNORMAL HIGH (ref 96–106)
Creatinine, Ser: 0.88 mg/dL (ref 0.57–1.00)
Glucose: 88 mg/dL (ref 70–99)
Potassium: 4.3 mmol/L (ref 3.5–5.2)
Sodium: 144 mmol/L (ref 134–144)
eGFR: 72 mL/min/{1.73_m2} (ref >59)

## 2021-12-10 ENCOUNTER — Ambulatory Visit: Payer: Medicare Other | Admitting: Nurse Practitioner

## 2021-12-28 ENCOUNTER — Encounter: Payer: Self-pay | Admitting: Nurse Practitioner

## 2021-12-28 ENCOUNTER — Ambulatory Visit (INDEPENDENT_AMBULATORY_CARE_PROVIDER_SITE_OTHER): Payer: Medicare Other | Admitting: Nurse Practitioner

## 2021-12-28 VITALS — BP 158/72 | HR 66 | Ht 65.0 in | Wt 146.0 lb

## 2021-12-28 DIAGNOSIS — Z720 Tobacco use: Secondary | ICD-10-CM | POA: Diagnosis not present

## 2021-12-28 DIAGNOSIS — I10 Essential (primary) hypertension: Secondary | ICD-10-CM

## 2021-12-28 MED ORDER — LISINOPRIL-HYDROCHLOROTHIAZIDE 20-25 MG PO TABS: 1.0000 | ORAL_TABLET | Freq: Every day | ORAL | 3 refills | Status: DC

## 2021-12-28 NOTE — Progress Notes (Signed)
   Jennifer Armstrong     MRN: [id number]      DOB: November 19, 1950   HPI Jennifer Armstrong with past medical history of essential hypertension, CKD stage II, tobacco use, mixed hyperlipidemia is here for follow up for hypertension.   Pt reports that she has been taking liusinopril- hydrochlorothiazide 20-12.5mg  daily  as ordered.  She reports that she has been checking her blood pressure at home but she did not bring her blood pressure log to the office today , states that her systolic blood pressure is sometimes in the 150s, 170s ,denies chest pain, edema, dizziness, states that she has been smoking daily daily, smoking 1 cigartees daily , adding salt to her food.       ROS Denies recent fever or chills. Denies sinus pressure, nasal congestion, ear pain or sore throat. Denies chest congestion, productive cough or wheezing. Denies chest pains, palpitations and leg swelling Denies abdominal pain, nausea, vomiting,diarrhea or constipation.   Denies dysuria, frequency, hesitancy or incontinence. Denies joint pain, swelling and limitation in mobility. Denies headaches, seizures, numbness, or tingling. Denies depression, anxiety or insomnia.   PE  BP (!) 154/72 (BP Location: Right Arm, Cuff Size: Normal)   Pulse 66   Ht 5\' 5"  (1.651 m)   Wt 146 lb (66.2 kg)   SpO2 97%   BMI 24.30 kg/m   Patient alert and oriented and in no cardiopulmonary distress.  Chest: Clear to auscultation bilaterally.  CVS: S1, S2 no murmurs, no S3.Regular rate.  ABD: Soft non tender.   Ext: No edema  MS: Adequate ROM spine, shoulders, hips and knees.  Psych: Good eye contact, normal affect. Memory intact not anxious or depressed appearing.  CNS: CN 2-12 intact, power,  normal throughout.no focal deficits noted.   Assessment & Plan  Essential hypertension BP Readings from Last 3 Encounters:  12/28/21 (!) 158/72  11/12/21 (!) 142/68  08/27/21 (!) 143/74   Chronic condition uncontrolled Currently on  lisinopril-hydrochlorothiazide 20-12.5 mg daily Reports that she has been adding salt to her food, Start lisinopril-hydrochlorothiazide 20- 25 mg ,1 tablet daily Monitor blood pressure at home keep a log and bring to next follow-up appointment in 4 weeks. DASH diet advised, engage in regular exercises at least 150 minutes weekly BMP in 2 weeks  Tobacco use Smokes about 1 cigaretee /day, she was previously smoking half pack daily, patient congratulated on cutting back on smoking.  Asked about quitting: confirms that he/she currently smokes cigarettes Advise to quit smoking: Educated about QUITTING to reduce the risk of cancer, cardio and cerebrovascular disease. Assess willingness: Unwilling to quit at this time, but is working on cutting back. Assist with counseling and pharmacotherapy: Counseled for 5 minutes and literature provided. Arrange for follow up: follow up in 4 weeks  and continue to offer help.

## 2021-12-28 NOTE — Patient Instructions (Signed)
Please stat taking lisinopril- hydrochlorothiazide 20-25 mg, 1 tablet daily   Please check your blood pressure before taking the medicine and 3 hours after taking the medicine, write down the numbers and bring to your next appointment   Please avoid salty foods and continue to work on smoking cessation .    Please get your shingles vaccine at your pharmacy .  It is important that you exercise regularly at least 30 minutes 5 times a week.  Think about what you will eat, plan ahead. Choose " clean, green, fresh or frozen" over canned, processed or packaged foods which are more sugary, salty and fatty. 70 to 75% of food eaten should be vegetables and fruit. Three meals at set times with snacks allowed between meals, but they must be fruit or vegetables. Aim to eat over a 12 hour period , example 7 am to 7 pm, and STOP after  your last meal of the day. Drink water,generally about 64 ounces per day, no other drink is as healthy. Fruit juice is best enjoyed in a healthy way, by EATING the fruit.  Thanks for choosing [facility], we consider it a privelige to serve you.

## 2021-12-28 NOTE — Assessment & Plan Note (Signed)
BP Readings from Last 3 Encounters:  12/28/21 (!) 158/72  11/12/21 (!) 142/68  08/27/21 (!) 143/74   Chronic condition uncontrolled Currently on lisinopril-hydrochlorothiazide 20-12.5 mg daily Reports that she has been adding salt to her food, Start lisinopril-hydrochlorothiazide 20- 25 mg ,1 tablet daily Monitor blood pressure at home keep a log and bring to next follow-up appointment in 4 weeks. DASH diet advised, engage in regular exercises at least 150 minutes weekly BMP in 2 weeks

## 2021-12-28 NOTE — Assessment & Plan Note (Signed)
Smokes about 1 cigaretee /day, she was previously smoking half pack daily, patient congratulated on cutting back on smoking.  Asked about quitting: confirms that he/she currently smokes cigarettes Advise to quit smoking: Educated about QUITTING to reduce the risk of cancer, cardio and cerebrovascular disease. Assess willingness: Unwilling to quit at this time, but is working on cutting back. Assist with counseling and pharmacotherapy: Counseled for 5 minutes and literature provided. Arrange for follow up: follow up in 4 weeks  and continue to offer help.

## 2021-12-31 ENCOUNTER — Ambulatory Visit: Payer: Medicare Other | Admitting: Nurse Practitioner

## 2022-01-17 DIAGNOSIS — I10 Essential (primary) hypertension: Secondary | ICD-10-CM | POA: Diagnosis not present

## 2022-01-18 LAB — BASIC METABOLIC PANEL
BUN/Creatinine Ratio: 14 (ref 12–28)
BUN: 12 mg/dL (ref 8–27)
CO2: 24 mmol/L (ref 20–29)
Calcium: 9.3 mg/dL (ref 8.7–10.3)
Chloride: 105 mmol/L (ref 96–106)
Creatinine, Ser: 0.85 mg/dL (ref 0.57–1.00)
Glucose: 88 mg/dL (ref 70–99)
Potassium: 4.3 mmol/L (ref 3.5–5.2)
Sodium: 142 mmol/L (ref 134–144)
eGFR: 75 mL/min/{1.73_m2} (ref >59)

## 2022-01-27 ENCOUNTER — Encounter: Payer: Self-pay | Admitting: Nurse Practitioner

## 2022-01-27 ENCOUNTER — Ambulatory Visit (INDEPENDENT_AMBULATORY_CARE_PROVIDER_SITE_OTHER): Payer: Medicare Other | Admitting: Nurse Practitioner

## 2022-01-27 VITALS — BP 162/72 | HR 57 | Ht 65.0 in | Wt 140.0 lb

## 2022-01-27 DIAGNOSIS — E559 Vitamin D deficiency, unspecified: Secondary | ICD-10-CM | POA: Diagnosis not present

## 2022-01-27 DIAGNOSIS — Z139 Encounter for screening, unspecified: Secondary | ICD-10-CM

## 2022-01-27 DIAGNOSIS — I10 Essential (primary) hypertension: Secondary | ICD-10-CM | POA: Diagnosis not present

## 2022-01-27 DIAGNOSIS — E782 Mixed hyperlipidemia: Secondary | ICD-10-CM

## 2022-01-27 MED ORDER — AMLODIPINE BESYLATE 2.5 MG PO TABS: 2.5000 mg | ORAL_TABLET | Freq: Every day | ORAL | 0 refills | Status: DC

## 2022-01-27 MED ORDER — ATORVASTATIN CALCIUM 20 MG PO TABS: 20.0000 mg | ORAL_TABLET | Freq: Every day | ORAL | 3 refills | Status: DC

## 2022-01-27 NOTE — Assessment & Plan Note (Signed)
  BP Readings from Last 3 Encounters:  01/27/22 (!) 162/72  12/28/21 (!) 158/72  11/12/21 (!) 142/68  Condition uncontrolled uncontrolled on lisinopril-hydrochlorothiazide 20-25 mg 1 tablet daily Start amlodipine 2.5 mg daily continue current dose of lisinopril-hydrochlorothiazide 20-25-mg daily  DASH diet advised engage in regular walking exercises daily Follow-up in 4 weeks Monitor blood pressure at home and keep a log

## 2022-01-27 NOTE — Progress Notes (Signed)
   Jennifer Armstrong     MRN: [id number]      DOB: 13-Dec-1950   HPI [name] with past medical history of hypertension, CKD stage II, tobacco use is here for follow up for hypertension.  Currently taking lisinopril-hydrochlorothiazide 20-25 mg 1 tablet daily.  Patient stated that she has been taking her medication daily as ordered.  Denies headache, dizziness, edema.  She quit smoking tobacco about 4 weeks ago    ROS Denies recent fever or chills. Denies sinus pressure, nasal congestion, ear pain or sore throat. Denies chest congestion, productive cough or wheezing. Denies chest pains, palpitations and leg swelling Denies abdominal pain, nausea, vomiting,diarrhea or constipation.   Denies dysuria, frequency, hesitancy or incontinence. Denies joint pain, swelling and limitation in mobility. Denies headaches, seizures, numbness, or tingling. Denies depression, anxiety or insomnia.    PE  BP (!) 168/80 (BP Location: Right Arm, Cuff Size: Normal)   Pulse (!) 57   Ht 5\' 5"  (1.651 m)   Wt 140 lb (63.5 kg)   SpO2 97%   BMI 23.30 kg/m   Patient alert and oriented and in no cardiopulmonary distress.   Chest: Clear to auscultation bilaterally.  CVS: S1, S2 no murmurs, no S3.Regular rate.  ABD: Soft non tender.   Ext: No edema  MS: Adequate ROM spine, shoulders, hips and knees.  Psych: Good eye contact, normal affect. Memory intact not anxious or depressed appearing.  CNS: CN 2-12 intact, power,  normal throughout.no focal deficits noted.   Assessment & Plan  Essential hypertension  BP Readings from Last 3 Encounters:  01/27/22 (!) 162/72  12/28/21 (!) 158/72  11/12/21 (!) 142/68  Condition uncontrolled uncontrolled on lisinopril-hydrochlorothiazide 20-25 mg 1 tablet daily Start amlodipine 2.5 mg daily continue current dose of lisinopril-hydrochlorothiazide 20-25-mg daily  DASH diet advised engage in regular walking exercises daily Follow-up in 4 weeks Monitor  blood pressure at home and keep a log

## 2022-01-27 NOTE — Patient Instructions (Addendum)
Please start taking amlodipine 2.5mg  daily. Continue lisinopril-hydrochlorothiazide 20-25mg  one tablet daily. Continue to monitor your blood pressure at home, keep a log and bring to next visit. Bring your blood pressure machine with you ,. Avoid salt and exercise daily   Congratulations on quitting cigarettes smoking.  Please get your shingles vaccine at your pharmacy   It is important that you exercise regularly at least 30 minutes 5 times a week.  Think about what you will eat, plan ahead. Choose " clean, green, fresh or frozen" over canned, processed or packaged foods which are more sugary, salty and fatty. 70 to 75% of food eaten should be vegetables and fruit. Three meals at set times with snacks allowed between meals, but they must be fruit or vegetables. Aim to eat over a 12 hour period , example 7 am to 7 pm, and STOP after  your last meal of the day. Drink water,generally about 64 ounces per day, no other drink is as healthy. Fruit juice is best enjoyed in a healthy way, by EATING the fruit.  Thanks for choosing [facility], we consider it a privelige to serve you.

## 2022-02-07 ENCOUNTER — Other Ambulatory Visit: Payer: Self-pay | Admitting: Nurse Practitioner

## 2022-02-07 DIAGNOSIS — I10 Essential (primary) hypertension: Secondary | ICD-10-CM

## 2022-02-28 ENCOUNTER — Other Ambulatory Visit: Payer: Self-pay | Admitting: Nurse Practitioner

## 2022-03-03 ENCOUNTER — Encounter: Payer: Medicare Other | Admitting: Nurse Practitioner

## 2022-05-09 ENCOUNTER — Telehealth: Payer: Self-pay | Admitting: Family Medicine

## 2022-05-09 ENCOUNTER — Other Ambulatory Visit: Payer: Self-pay

## 2022-05-09 ENCOUNTER — Ambulatory Visit (INDEPENDENT_AMBULATORY_CARE_PROVIDER_SITE_OTHER): Payer: Medicare Other | Admitting: Family Medicine

## 2022-05-09 ENCOUNTER — Other Ambulatory Visit: Payer: Self-pay | Admitting: Family Medicine

## 2022-05-09 ENCOUNTER — Encounter: Payer: Self-pay | Admitting: Family Medicine

## 2022-05-09 VITALS — BP 158/90 | HR 65 | Ht 63.0 in | Wt 139.1 lb

## 2022-05-09 DIAGNOSIS — I10 Essential (primary) hypertension: Secondary | ICD-10-CM | POA: Diagnosis not present

## 2022-05-09 DIAGNOSIS — Z2821 Immunization not carried out because of patient refusal: Secondary | ICD-10-CM | POA: Diagnosis not present

## 2022-05-09 MED ORDER — LISINOPRIL-HYDROCHLOROTHIAZIDE 20-25 MG PO TABS: 1.0000 | ORAL_TABLET | Freq: Every day | ORAL | 3 refills | Status: DC

## 2022-05-09 NOTE — Telephone Encounter (Signed)
Please inform the patient that a refill of lisinopril hydrochlorothiazide 20-25 mg has been sent to her pharmacy. Please advise the patient to take amlodipine 2.5 mg AND lisinopril hydrochlorothiazide 20-25 mg daily.

## 2022-05-09 NOTE — Assessment & Plan Note (Addendum)
-  Uncontrolled -Encouraged the patient to call [facility] with the name of the blood pressure medication she is currently taking so a refill of the medication she is not taking can be sent to her pharmacy, and she can start treatment. -Informed the patient that she should be taking 2 different blood pressure medications daily for better control of her blood pressure -She denies headaches, visual disturbances, chest pain, shortness of breath, and palpitations -We will follow up on blood pressure in 4 weeks BP Readings from Last 3 Encounters:  05/09/22 (!) 158/90  01/27/22 (!) 162/72  12/28/21 (!) 158/72

## 2022-05-09 NOTE — Telephone Encounter (Signed)
Patient called back with med info   Patient is taking  AMLODIPINE 2.5 MG

## 2022-05-09 NOTE — Patient Instructions (Addendum)
I appreciate the opportunity to provide care to you today!    Follow up:  4 weeks  Please call [place] [facility] with the name of the medication that you are currently taking at home, so that I can send in a prescription of the medication that you are not taking.  You should be taking amlodipine 2.5 mg daily and lisinopril hydrochlorothiazide 20-25 mg once daily.  You should be taking 2 different blood pressure medications daily and not just one blood pressure medication daily.   Please continue to a heart-healthy diet and increase your physical activities. Try to exercise for 35mins at least three times a week.      It was a pleasure to see you and I look forward to continuing to work together on your health and well-being. Please do not hesitate to call the office if you need care or have questions about your care.   Have a wonderful day and week. With Gratitude, Alvira Monday MSN, FNP-BC

## 2022-05-09 NOTE — Telephone Encounter (Signed)
Second attempt vm full.

## 2022-05-09 NOTE — Progress Notes (Signed)
Subjective:  Patient ID: Jennifer Armstrong, female    DOB: 13-Sep-1950  Age: 71 y.o. MRN: [id number]  CC:  Chief Complaint  Patient presents with   Follow-up    Hypertension follow up.     HPI Jennifer Armstrong is a 71 y.o. female with past medical history of essential hypertension, and osteoporosis presents for f/u of  chronic medical conditions.  Hypertension: She is prescribed amlodipine 2.5 mg daily and lisinopril hydrochlorothiazide 20-25 mg once daily.  She reports that she takes 1 blood pressure medication daily.  She is unsure of the name of the blood pressure medication that she is currently taking.  She was started on amlodipine 2.5 mg daily at her previous visit on 01/27/2022.  She denies headaches, blurred vision, chest pain, palpitations, and shortness of breath.  Past Medical History:  Diagnosis Date   Hyperlipidemia    Hypertension    Knee pain, right    Tibial plateau fracture     Past Surgical History:  Procedure Laterality Date   ABDOMINAL HYSTERECTOMY     KNEE SURGERY      Family History  Problem Relation Age of Onset   Cancer Mother    Heart attack Father    Sickle cell trait Son    Sickle cell trait Son    Sickle cell anemia Daughter     Social History   Socioeconomic History   Marital status: Single    Spouse name: Not on file   Number of children: 2   Years of education: Not on file   Highest education level: Not on file  Occupational History   Not on file  Tobacco Use   Smoking status: Former    Packs/day: 0.50    Types: Cigarettes    Start date: 07/25/1977    Quit date: 12/2021    Years since quitting: 0.3   Smokeless tobacco: Never   Tobacco comments:    Used to smoke almost a pack now cut back to 0.5pack a day , has been smoking for over 50 years.   Vaping Use   Vaping Use: Never used  Substance and Sexual Activity   Alcohol use: Not Currently   Drug use: Never   Sexual activity: Yes  Other Topics Concern   Not on file  Social  History Narrative   Lives with her son .    Social Determinants of Health   Financial Resource Strain: Not on file  Food Insecurity: Not on file  Transportation Needs: Not on file  Physical Activity: Not on file  Stress: Not on file  Social Connections: Not on file  Intimate Partner Violence: Not on file    Outpatient Medications Prior to Visit  Medication Sig Dispense Refill   lisinopril-hydrochlorothiazide (ZESTORETIC) 20-25 MG tablet Take 1 tablet by mouth daily. 90 tablet 3   amLODipine (NORVASC) 2.5 MG tablet Take 1 tablet (2.5 mg total) by mouth daily. (Patient not taking: Reported on 05/09/2022) 60 tablet 0   atorvastatin (LIPITOR) 20 MG tablet TAKE 1 TABLET(20 MG) BY MOUTH DAILY (Patient not taking: Reported on 05/09/2022) 90 tablet 3   No facility-administered medications prior to visit.    No Known Allergies  ROS Review of Systems  Constitutional:  Negative for fatigue and fever.  HENT:  Negative for congestion.   Eyes:  Negative for visual disturbance.  Respiratory:  Negative for chest tightness and shortness of breath.   Cardiovascular:  Negative for chest pain and palpitations.  Neurological:  Negative  for dizziness and headaches.      Objective:    Physical Exam  BP (!) 158/90 (BP Location: Left Arm)   Pulse 65   Ht _0  (1.6 m)   Wt 139 lb 1.9 oz (63.1 kg)   SpO2 96%   BMI 24.64 kg/m  Wt Readings from Last 3 Encounters:  05/09/22 139 lb 1.9 oz (63.1 kg)  01/27/22 140 lb (63.5 kg)  12/28/21 146 lb (66.2 kg)    Lab Results  Component Value Date   TSH 3.26 03/08/2021   Lab Results  Component Value Date   WBC 4.9 08/27/2021   HGB 11.7 08/27/2021   HCT 35.2 08/27/2021   MCV 94 08/27/2021   PLT 247 08/27/2021   Lab Results  Component Value Date   NA 142 01/17/2022   K 4.3 01/17/2022   CO2 24 01/17/2022   GLUCOSE 88 01/17/2022   BUN 12 01/17/2022   CREATININE 0.85 01/17/2022   BILITOT 0.4 04/05/2021   ALKPHOS 61 11/22/2010   AST 14  04/05/2021   ALT 10 04/05/2021   PROT 7.2 04/05/2021   ALBUMIN 3.8 11/22/2010   CALCIUM 9.3 01/17/2022   EGFR 75 01/17/2022   Lab Results  Component Value Date   CHOL 146 08/27/2021   Lab Results  Component Value Date   HDL 44 08/27/2021   Lab Results  Component Value Date   LDLCALC 84 08/27/2021   Lab Results  Component Value Date   TRIG 99 08/27/2021   Lab Results  Component Value Date   CHOLHDL 3.3 08/27/2021   Lab Results  Component Value Date   HGBA1C 5.4 03/08/2021      Assessment & Plan:   Problem List Items Addressed This Visit       Cardiovascular and Mediastinum   Essential hypertension - Primary    -Uncontrolled -Encouraged the patient to call [facility] with the name of the blood pressure medication she is currently taking so a refill of the medication she is not taking can be sent to her pharmacy, and she can start treatment. -Informed the patient that she should be taking 2 different blood pressure medications daily for better control of her blood pressure -She denies headaches, visual disturbances, chest pain, shortness of breath, and palpitations -We will follow up on blood pressure in 4 weeks BP Readings from Last 3 Encounters:  05/09/22 (!) 158/90  01/27/22 (!) 162/72  12/28/21 (!) 158/72         Other Visit Diagnoses     Influenza vaccine refused           No orders of the defined types were placed in this encounter.   Follow-up: Return in about 1 month (around 06/09/2022) for BP.    Alvira Monday, FNP

## 2022-05-09 NOTE — Telephone Encounter (Signed)
Returned call unable to reach pt.  

## 2022-05-11 NOTE — Telephone Encounter (Signed)
Unable to reach pt

## 2022-06-06 ENCOUNTER — Ambulatory Visit: Payer: Medicare Other | Admitting: Family Medicine

## 2022-06-06 ENCOUNTER — Ambulatory Visit (INDEPENDENT_AMBULATORY_CARE_PROVIDER_SITE_OTHER): Payer: Medicare Other | Admitting: Internal Medicine

## 2022-06-06 ENCOUNTER — Encounter: Payer: Self-pay | Admitting: Internal Medicine

## 2022-06-06 VITALS — BP 112/69 | HR 105 | Ht 63.0 in | Wt 137.6 lb

## 2022-06-06 DIAGNOSIS — N182 Chronic kidney disease, stage 2 (mild): Secondary | ICD-10-CM

## 2022-06-06 DIAGNOSIS — I10 Essential (primary) hypertension: Secondary | ICD-10-CM

## 2022-06-06 DIAGNOSIS — E782 Mixed hyperlipidemia: Secondary | ICD-10-CM | POA: Diagnosis not present

## 2022-06-06 DIAGNOSIS — E559 Vitamin D deficiency, unspecified: Secondary | ICD-10-CM

## 2022-06-06 DIAGNOSIS — Z0001 Encounter for general adult medical examination with abnormal findings: Secondary | ICD-10-CM | POA: Diagnosis not present

## 2022-06-06 MED ORDER — ATORVASTATIN CALCIUM 20 MG PO TABS: ORAL_TABLET | ORAL | 3 refills | Status: DC

## 2022-06-06 NOTE — Progress Notes (Signed)
Established Patient Office Visit  Subjective   Patient ID: Jennifer Armstrong, female    DOB: October 19, 1950  Age: 71 y.o. MRN: [id number]  Chief Complaint  Patient presents with   Follow-up    BP   [name] returns to care today for BP check.  She was last seen at [facility] on 10/16 by Alvira Monday, NP for routine follow-up.  At that time her blood pressure was elevated (158/90).  She was previously prescribed amlodipine 2.5 mg daily and lisinopril-HCTZ 20-25 mg daily.  However, she cannot recall the name of the blood pressure medication she was currently taking.  She was counseled on the correct antihypertensive regimen and 4-week follow-up was arranged for BP check.  There have been no acute interval events.  [name] is asymptomatic today acute concerns to discuss.  She states that she is only taking 1 medication currently, which is lisinopril-HCTZ.  She does not have any amlodipine and states that a refill was not provided after she ran out of medication previously.  Her blood pressure today is 112/69.   Past Medical History:  Diagnosis Date   Hyperlipidemia    Hypertension    Knee pain, right    Tibial plateau fracture    Past Surgical History:  Procedure Laterality Date   ABDOMINAL HYSTERECTOMY     KNEE SURGERY     Social History   Tobacco Use   Smoking status: Former    Packs/day: 0.50    Types: Cigarettes    Start date: 07/25/1977    Quit date: 12/2021    Years since quitting: 0.4   Smokeless tobacco: Never   Tobacco comments:    Used to smoke almost a pack now cut back to 0.5pack a day , has been smoking for over 50 years.   Vaping Use   Vaping Use: Never used  Substance Use Topics   Alcohol use: Not Currently   Drug use: Never   Family History  Problem Relation Age of Onset   Cancer Mother    Heart attack Father    Sickle cell trait Son    Sickle cell trait Son    Sickle cell anemia Daughter    No Known Allergies  Review of Systems  Constitutional:   Negative for chills and fever.  HENT:  Negative for sore throat.   Respiratory:  Negative for cough and shortness of breath.   Cardiovascular:  Negative for chest pain, palpitations and leg swelling.  Gastrointestinal:  Negative for abdominal pain, blood in stool, constipation, diarrhea, nausea and vomiting.  Genitourinary:  Negative for dysuria and hematuria.  Musculoskeletal:  Negative for myalgias.  Skin:  Negative for itching and rash.  Neurological:  Negative for dizziness and headaches.  Psychiatric/Behavioral:  Negative for depression and suicidal ideas.       Objective:     BP 112/69   Pulse (!) 105   Ht _0  (1.6 m)   Wt 137 lb 9.6 oz (62.4 kg)   SpO2 95%   BMI 24.37 kg/m  BP Readings from Last 3 Encounters:  06/06/22 112/69  05/09/22 (!) 158/90  01/27/22 (!) 162/72   Physical Exam Vitals reviewed.  Constitutional:      General: She is not in acute distress.    Appearance: Normal appearance. She is not toxic-appearing.  HENT:     Head: Normocephalic and atraumatic.     Right Ear: External ear normal.     Left Ear: External ear normal.  Nose: Nose normal. No congestion or rhinorrhea.     Mouth/Throat:     Mouth: Mucous membranes are moist.     Pharynx: Oropharynx is clear. No oropharyngeal exudate or posterior oropharyngeal erythema.  Eyes:     General: No scleral icterus.    Extraocular Movements: Extraocular movements intact.     Conjunctiva/sclera: Conjunctivae normal.     Pupils: Pupils are equal, round, and reactive to light.  Cardiovascular:     Rate and Rhythm: Normal rate and regular rhythm.     Pulses: Normal pulses.     Heart sounds: Normal heart sounds. No murmur heard.    No friction rub. No gallop.  Pulmonary:     Effort: Pulmonary effort is normal.     Breath sounds: Normal breath sounds. No wheezing, rhonchi or rales.  Abdominal:     General: Abdomen is flat. Bowel sounds are normal. There is no distension.     Palpations: Abdomen is  soft.     Tenderness: There is no abdominal tenderness.  Musculoskeletal:        General: No swelling. Normal range of motion.     Cervical back: Normal range of motion.     Right lower leg: No edema.     Left lower leg: No edema.  Lymphadenopathy:     Cervical: No cervical adenopathy.  Skin:    General: Skin is warm and dry.     Capillary Refill: Capillary refill takes less than 2 seconds.     Coloration: Skin is not jaundiced.  Neurological:     General: No focal deficit present.     Mental Status: She is alert and oriented to person, place, and time.  Psychiatric:        Mood and Affect: Mood normal.        Behavior: Behavior normal.    Last CBC Lab Results  Component Value Date   WBC 4.9 08/27/2021   HGB 11.7 08/27/2021   HCT 35.2 08/27/2021   MCV 94 08/27/2021   MCH 31.3 08/27/2021   RDW 11.9 08/27/2021   PLT 247 30/86/5784   Last metabolic panel Lab Results  Component Value Date   GLUCOSE 88 01/17/2022   NA 142 01/17/2022   K 4.3 01/17/2022   CL 105 01/17/2022   CO2 24 01/17/2022   BUN 12 01/17/2022   CREATININE 0.85 01/17/2022   EGFR 75 01/17/2022   CALCIUM 9.3 01/17/2022   PROT 7.2 04/05/2021   ALBUMIN 3.8 11/22/2010   BILITOT 0.4 04/05/2021   ALKPHOS 61 11/22/2010   AST 14 04/05/2021   ALT 10 04/05/2021   Last lipids Lab Results  Component Value Date   CHOL 146 08/27/2021   HDL 44 08/27/2021   LDLCALC 84 08/27/2021   TRIG 99 08/27/2021   CHOLHDL 3.3 08/27/2021   Last hemoglobin A1c Lab Results  Component Value Date   HGBA1C 5.4 03/08/2021   Last thyroid functions Lab Results  Component Value Date   TSH 3.26 03/08/2021   Last vitamin D Lab Results  Component Value Date   VD25OH 17 (L) 04/05/2021    The 10-year ASCVD risk score (Arnett DK, et al., 2019) is: 6.2%    Assessment & Plan:   Problem List Items Addressed This Visit       Essential hypertension - Primary    BP 112/69 today.  She is currently taking lisinopril-HCTZ  20-25 mg daily.  She was previously prescribed amlodipine 2.5 mg daily as well, but has not  taken the medication since running out months ago.  Given adequately controlled BP today on lisinopril-HCTZ alone, we will continue this regimen.  I have counseled her on contacting our office before she runs out of medications and that antihypertensive medications are often prescribed indefinitely, not to be discontinued upon completion of her current prescription.  She expressed understanding.      Mixed hyperlipidemia    She is currently prescribed atorvastatin 20 mg daily, however she has been out of medication recently and did not know she was supposed to continue taking it. -Atorvastatin refilled today.  She will follow-up in 2 months with repeat lipid panel to be completed prior to her appointment.      Return in about 2 months (around 08/06/2022).    Johnette Abraham, MD

## 2022-06-06 NOTE — Assessment & Plan Note (Signed)
BP 112/69 today.  She is currently taking lisinopril-HCTZ 20-25 mg daily.  She was previously prescribed amlodipine 2.5 mg daily as well, but has not taken the medication since running out months ago.  Given adequately controlled BP today on lisinopril-HCTZ alone, we will continue this regimen.  I have counseled her on contacting our office before she runs out of medications and that antihypertensive medications are often prescribed indefinitely, not to be discontinued upon completion of her current prescription.  She expressed understanding.

## 2022-06-06 NOTE — Patient Instructions (Signed)
It was a pleasure to see you today.  Thank you for giving [place] the opportunity to be involved in your care.  Below is a brief recap of your visit and next steps.  We will plan to see you again in 2 months.  Summary Your blood pressure looks great today. Continue taking lisinopril-HCTZ as prescribed. Please let us know before you run out of medications. I have refilled atorvastatin today We will follow up in January. Please complete your bloodwork prior to your appointment.

## 2022-06-06 NOTE — Assessment & Plan Note (Signed)
She is currently prescribed atorvastatin 20 mg daily, however she has been out of medication recently and did not know she was supposed to continue taking it. -Atorvastatin refilled today.  She will follow-up in 2 months with repeat lipid panel to be completed prior to her appointment.

## 2022-06-11 IMAGING — MG MM DIGITAL SCREENING BILAT W/ TOMO AND CAD
8 series · 9 of 24 positions shown · non-contrast
Comparison: None.

CLINICAL DATA: Screening.

EXAM:
DIGITAL SCREENING BILATERAL MAMMOGRAM WITH TOMOSYNTHESIS AND CAD
TECHNIQUE: Bilateral screening digital craniocaudal and mediolateral oblique
mammograms were obtained. Bilateral screening digital breast
tomosynthesis was performed. The images were evaluated with
computer-aided detection.

[L MLO synth-2D]
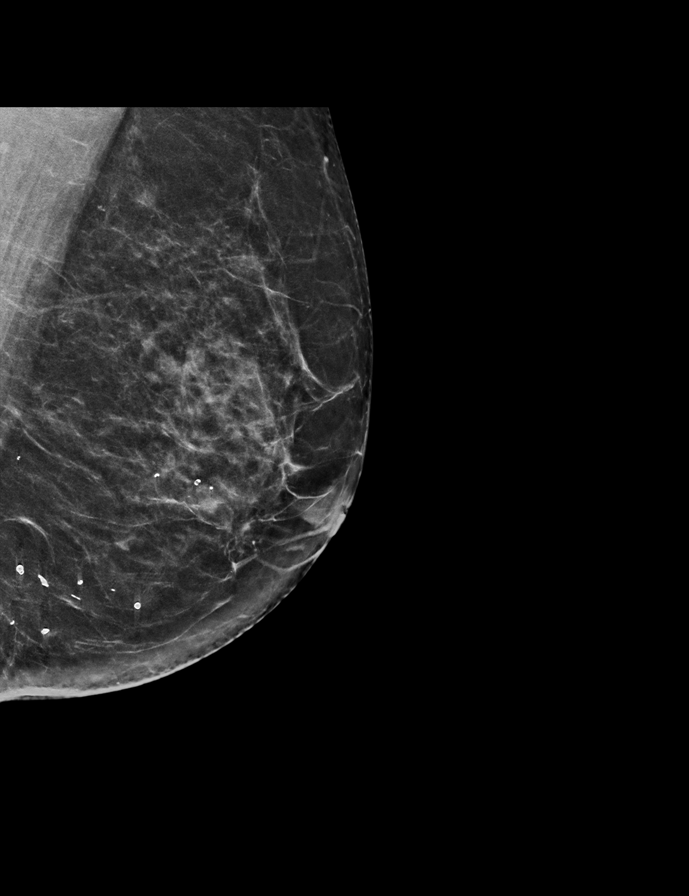

[R CC synth-2D]
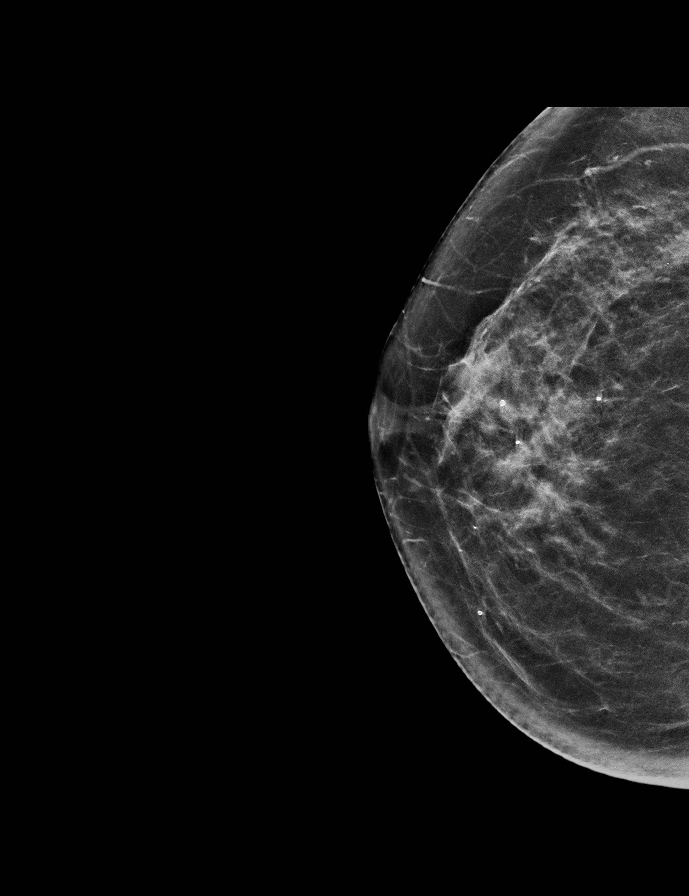

[L CC synth-2D]
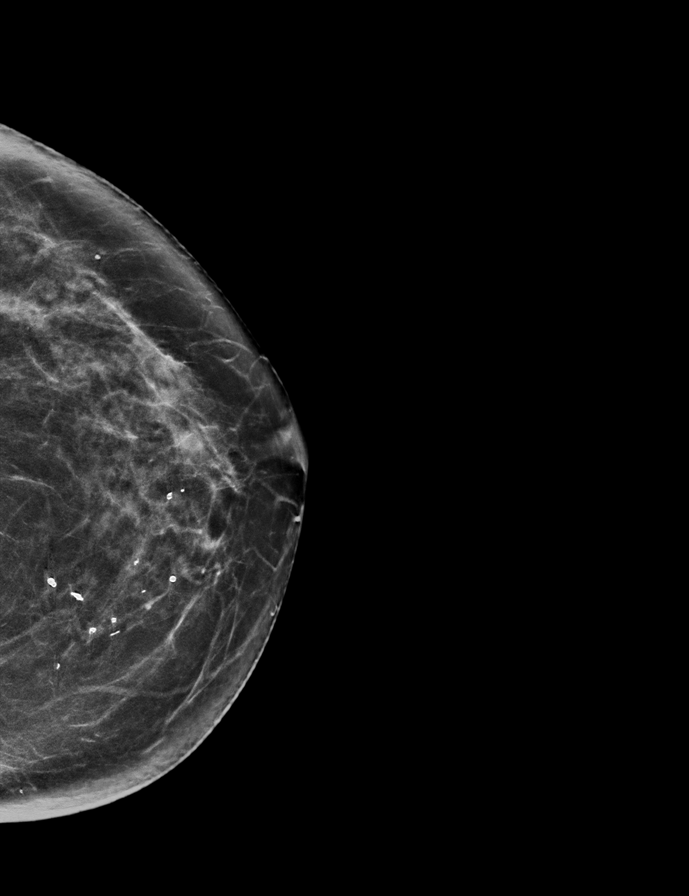

[R MLO synth-2D]
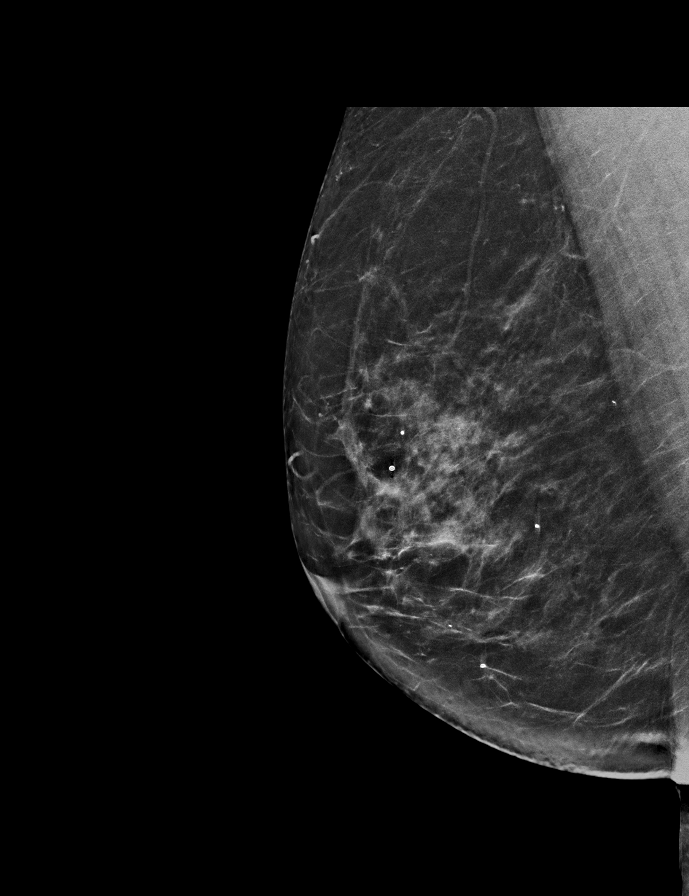

[L CC tomo · 2 of 70 frames shown]
[frame 23/70]
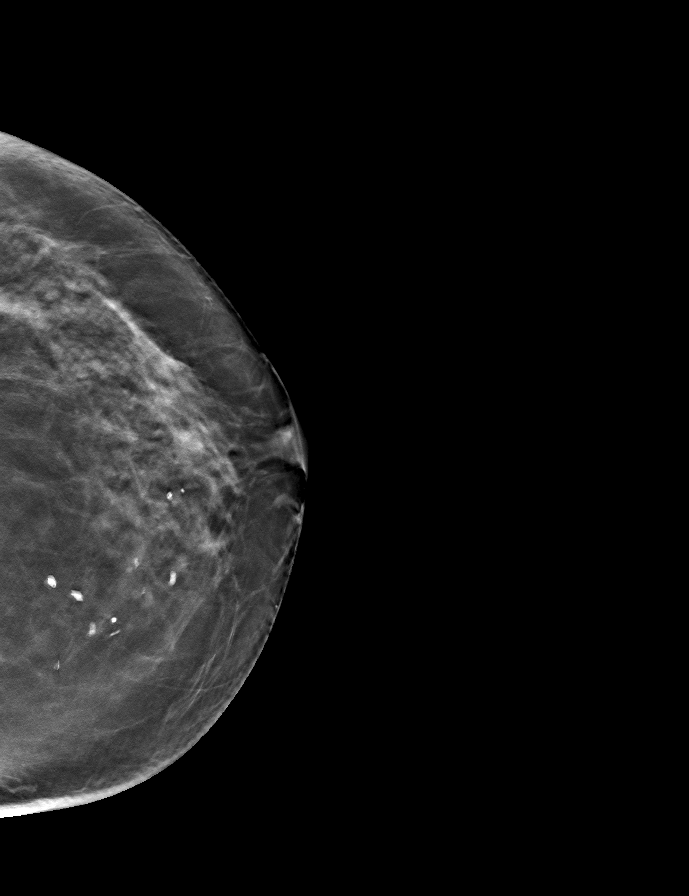
[frame 35/70]
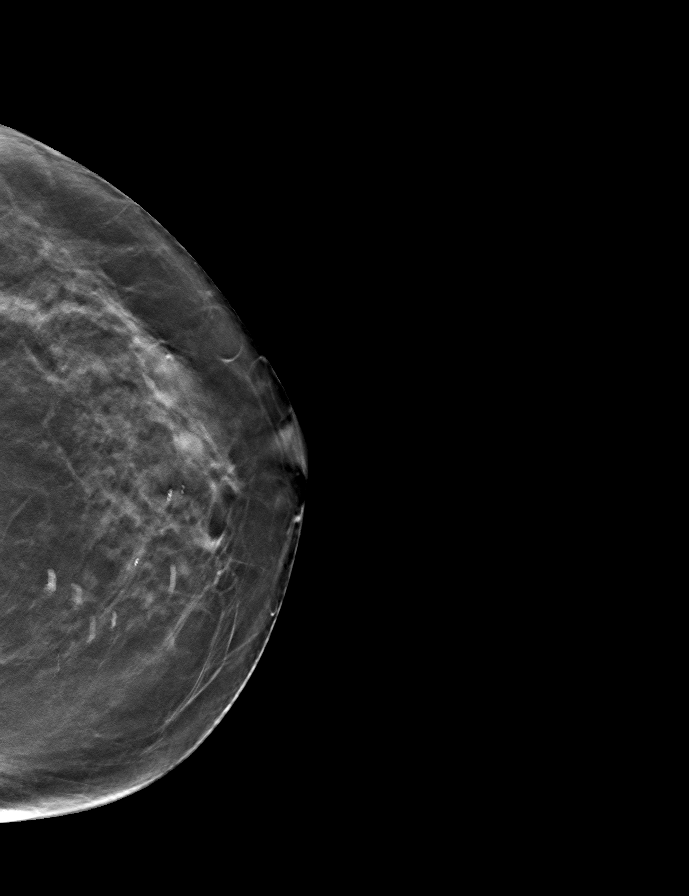

[R MLO tomo · tomo slice 37/72.0]
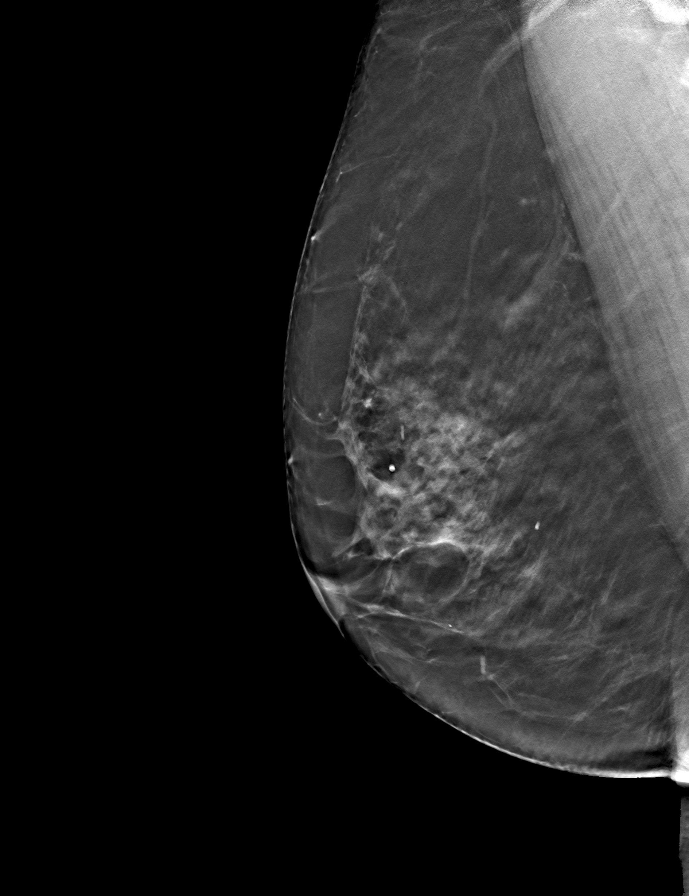

[L MLO tomo · tomo slice 35/69.0]
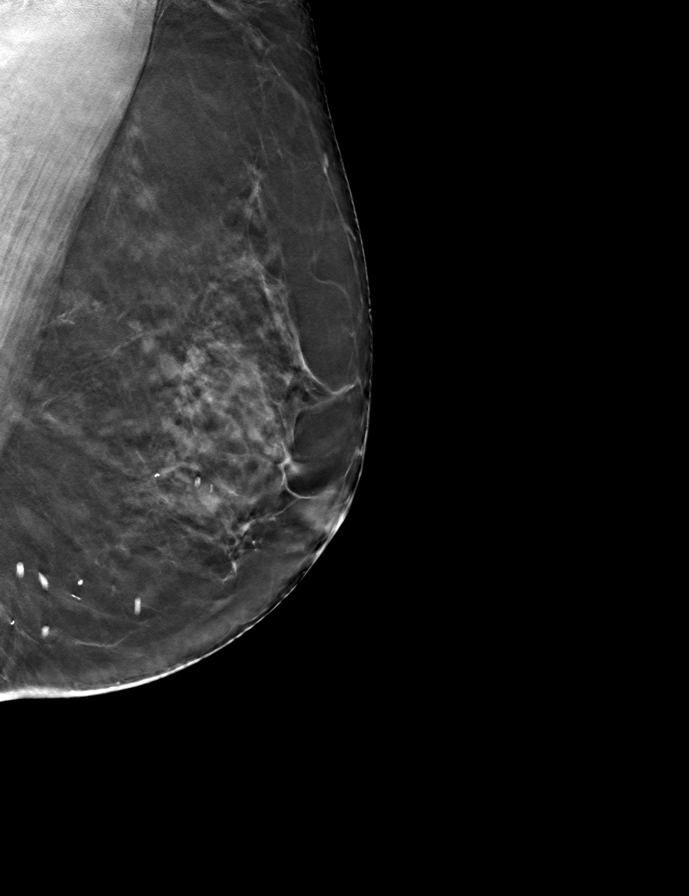

[R CC tomo · tomo slice 35/70.0]
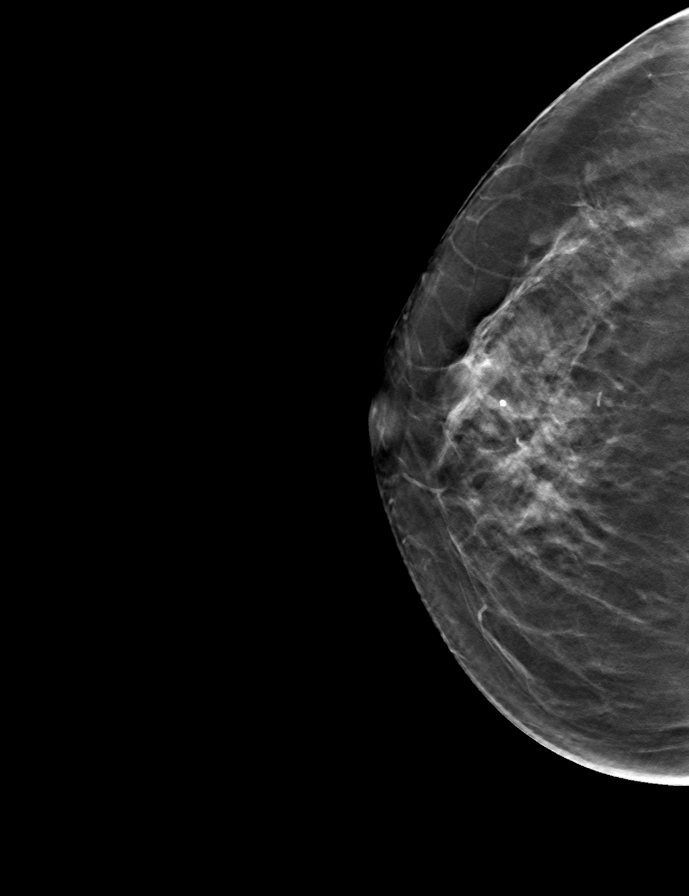

[9 of 24 positions shown; findings below may reference images not displayed]

ACR Breast Density Category c: The breast tissue is heterogeneously
dense, which may obscure small masses
FINDINGS: There are no findings suspicious for malignancy.
IMPRESSION: No mammographic evidence of malignancy. A result letter of this
screening mammogram will be mailed directly to the patient.

RECOMMENDATION:
Screening mammogram in one year. (Code:C8-T-HNK)

BI-RADS CATEGORY  1: Negative.

## 2022-07-22 ENCOUNTER — Other Ambulatory Visit: Payer: Self-pay | Admitting: Internal Medicine

## 2022-07-22 DIAGNOSIS — E782 Mixed hyperlipidemia: Secondary | ICD-10-CM | POA: Diagnosis not present

## 2022-07-23 LAB — LIPID PANEL
Chol/HDL Ratio: 3.1 ratio (ref 0.0–4.4)
Cholesterol, Total: 193 mg/dL (ref 100–199)
HDL: 63 mg/dL (ref >39)
LDL Chol Calc (NIH): 118 mg/dL — ABNORMAL HIGH (ref 0–99)
Triglycerides: 66 mg/dL (ref 0–149)
VLDL Cholesterol Cal: 12 mg/dL (ref 5–40)

## 2022-08-08 ENCOUNTER — Ambulatory Visit (INDEPENDENT_AMBULATORY_CARE_PROVIDER_SITE_OTHER): Payer: 59 | Admitting: Internal Medicine

## 2022-08-08 ENCOUNTER — Encounter: Payer: Self-pay | Admitting: Internal Medicine

## 2022-08-08 VITALS — BP 140/60 | HR 85 | Ht 63.0 in | Wt 133.6 lb

## 2022-08-08 DIAGNOSIS — E782 Mixed hyperlipidemia: Secondary | ICD-10-CM | POA: Diagnosis not present

## 2022-08-08 DIAGNOSIS — Z1212 Encounter for screening for malignant neoplasm of rectum: Secondary | ICD-10-CM | POA: Diagnosis not present

## 2022-08-08 DIAGNOSIS — Z1211 Encounter for screening for malignant neoplasm of colon: Secondary | ICD-10-CM | POA: Insufficient documentation

## 2022-08-08 DIAGNOSIS — I10 Essential (primary) hypertension: Secondary | ICD-10-CM

## 2022-08-08 DIAGNOSIS — E559 Vitamin D deficiency, unspecified: Secondary | ICD-10-CM | POA: Diagnosis not present

## 2022-08-08 DIAGNOSIS — Z2821 Immunization not carried out because of patient refusal: Secondary | ICD-10-CM | POA: Diagnosis not present

## 2022-08-08 MED ORDER — AMLODIPINE BESYLATE 5 MG PO TABS
5.0000 mg | ORAL_TABLET | Freq: Every day | ORAL | 2 refills | Status: DC
Start: 2022-08-08 — End: 2022-12-30

## 2022-08-08 MED ORDER — ATORVASTATIN CALCIUM 40 MG PO TABS: 40.0000 mg | ORAL_TABLET | Freq: Every day | ORAL | 2 refills | Status: DC

## 2022-08-08 NOTE — Assessment & Plan Note (Signed)
History of vitamin D deficiency, last vitamin D level updated in September 2022 (17).  She is not currently on vitamin D supplementation. -Repeat vitamin D level ordered today

## 2022-08-08 NOTE — Assessment & Plan Note (Signed)
Lipid panel updated in late December.  Total cholesterol 193 and LDL 118.  Her 10-year ASCVD risk for today is 13.1%.  She had previously stopped taking atorvastatin 20 mg daily, but this was restarted at her last appointment in November. -Increase atorvastatin to 40 mg daily as LDL remains above goal ( < 90) -Follow-up in 4 weeks

## 2022-08-08 NOTE — Patient Instructions (Signed)
It was a pleasure to see you today.  Thank you for giving [place] the opportunity to be involved in your care.  Below is a brief recap of your visit and next steps.  We will plan to see you again in 1 month.  Summary Increase atorvastatin to 40 mg daily Restart amlodipine 5 mg daily Repeat vitamin D level ordered Cologuard ordered for colon cancer screening

## 2022-08-08 NOTE — Assessment & Plan Note (Signed)
Cologuard has been ordered today for colorectal cancer screening

## 2022-08-08 NOTE — Assessment & Plan Note (Signed)
She is currently prescribed lisinopril-HCTZ 20-25 mg daily for treatment of hypertension.  She was previously prescribed amlodipine 2.5 mg daily, however this was discontinued at her last appointment she had not been taking amlodipine for several months and her blood pressure was adequately controlled.  Her blood pressure today was 170/78 initially and 176/76 on repeat.  He has been checking her blood pressure at home and reports systolic readings ranging 150-170 mmHg. -Start amlodipine 5 mg daily today -Continue lisinopril-HCTZ at current dose -Follow-up in 4 weeks for HTN

## 2022-08-08 NOTE — Progress Notes (Signed)
Established Patient Office Visit  Subjective   Patient ID: Jennifer Armstrong, female    DOB: October 18, 1950  Age: 72 y.o. MRN: [id number]  Chief Complaint  Patient presents with   Hypertension    Follow up   Jennifer Armstrong returns to care today for HTN and HLD follow-up.  She was last seen by me on 06/06/22 at which time her blood pressure was well-controlled.  She was prescribed lisinopril-HCTZ 20-25 mg daily and amlodipine 2.5 mg daily at that time, but reported that she had not been taking amlodipine turning out months ago.  Given adequately controlled BP on lisinopril-HCTZ alone, amlodipine was discontinued and she remained on this regimen.  She also reported that she had not been taking atorvastatin 20 mg daily as previously prescribed.  Atorvastatin was refilled and 67-month follow-up was arranged with a repeat lipid panel to be completed prior to her appointment.  There have been no acute interval events. Jennifer Armstrong reports feeling well today.  She has been taking her medications as prescribed.  She has no additional concerns to discuss today.  Past Medical History:  Diagnosis Date   Hyperlipidemia    Hypertension    Knee pain, right    Tibial plateau fracture    Past Surgical History:  Procedure Laterality Date   ABDOMINAL HYSTERECTOMY     KNEE SURGERY     Social History   Tobacco Use   Smoking status: Former    Packs/day: 0.50    Types: Cigarettes    Start date: 07/25/1977    Quit date: 12/2021    Years since quitting: 0.6   Smokeless tobacco: Never   Tobacco comments:    Used to smoke almost a pack now cut back to 0.5pack a day , has been smoking for over 50 years.   Vaping Use   Vaping Use: Never used  Substance Use Topics   Alcohol use: Not Currently   Drug use: Never   Family History  Problem Relation Age of Onset   Cancer Mother    Heart attack Father    Sickle cell trait Son    Sickle cell trait Son    Sickle cell anemia Daughter    No Known  Allergies  Review of Systems  Constitutional:  Negative for chills and fever.  HENT:  Negative for sore throat.   Respiratory:  Negative for cough and shortness of breath.   Cardiovascular:  Negative for chest pain, palpitations and leg swelling.  Gastrointestinal:  Negative for abdominal pain, blood in stool, constipation, diarrhea, nausea and vomiting.  Genitourinary:  Negative for dysuria and hematuria.  Musculoskeletal:  Negative for myalgias.  Skin:  Negative for itching and rash.  Neurological:  Negative for dizziness and headaches.  Psychiatric/Behavioral:  Negative for depression and suicidal ideas.      Objective:     BP (!) 140/60   Pulse 85   Ht 5\' 3"  (1.6 m)   Wt 133 lb 9.6 oz (60.6 kg)   SpO2 97%   BMI 23.67 kg/m  BP Readings from Last 3 Encounters:  08/08/22 (!) 140/60  06/06/22 112/69  05/09/22 (!) 158/90   Physical Exam Vitals reviewed.  Constitutional:      General: She is not in acute distress.    Appearance: Normal appearance. She is not toxic-appearing.  HENT:     Head: Normocephalic and atraumatic.     Right Ear: External ear normal.     Left Ear: External ear normal.  Nose: Nose normal. No congestion or rhinorrhea.     Mouth/Throat:     Mouth: Mucous membranes are moist.     Pharynx: Oropharynx is clear. No oropharyngeal exudate or posterior oropharyngeal erythema.  Eyes:     General: No scleral icterus.    Extraocular Movements: Extraocular movements intact.     Conjunctiva/sclera: Conjunctivae normal.     Pupils: Pupils are equal, round, and reactive to light.  Cardiovascular:     Rate and Rhythm: Normal rate and regular rhythm.     Pulses: Normal pulses.     Heart sounds: Normal heart sounds. No murmur heard.    No friction rub. No gallop.  Pulmonary:     Effort: Pulmonary effort is normal.     Breath sounds: Normal breath sounds. No wheezing, rhonchi or rales.  Abdominal:     General: Abdomen is flat. Bowel sounds are normal. There  is no distension.     Palpations: Abdomen is soft.     Tenderness: There is no abdominal tenderness.  Musculoskeletal:        General: No swelling. Normal range of motion.     Cervical back: Normal range of motion.     Right lower leg: No edema.     Left lower leg: No edema.  Lymphadenopathy:     Cervical: No cervical adenopathy.  Skin:    General: Skin is warm and dry.     Capillary Refill: Capillary refill takes less than 2 seconds.     Coloration: Skin is not jaundiced.  Neurological:     General: No focal deficit present.     Mental Status: She is alert and oriented to person, place, and time.  Psychiatric:        Mood and Affect: Mood normal.        Behavior: Behavior normal.   Last CBC Lab Results  Component Value Date   WBC 4.9 08/27/2021   HGB 11.7 08/27/2021   HCT 35.2 08/27/2021   MCV 94 08/27/2021   MCH 31.3 08/27/2021   RDW 11.9 08/27/2021   PLT 247 47/42/5956   Last metabolic panel Lab Results  Component Value Date   GLUCOSE 88 01/17/2022   NA 142 01/17/2022   K 4.3 01/17/2022   CL 105 01/17/2022   CO2 24 01/17/2022   BUN 12 01/17/2022   CREATININE 0.85 01/17/2022   EGFR 75 01/17/2022   CALCIUM 9.3 01/17/2022   PROT 7.2 04/05/2021   ALBUMIN 3.8 11/22/2010   BILITOT 0.4 04/05/2021   ALKPHOS 61 11/22/2010   AST 14 04/05/2021   ALT 10 04/05/2021   Last lipids Lab Results  Component Value Date   CHOL 193 07/22/2022   HDL 63 07/22/2022   LDLCALC 118 (H) 07/22/2022   TRIG 66 07/22/2022   CHOLHDL 3.1 07/22/2022   Last hemoglobin A1c Lab Results  Component Value Date   HGBA1C 5.4 03/08/2021   Last thyroid functions Lab Results  Component Value Date   TSH 3.26 03/08/2021   Last vitamin D Lab Results  Component Value Date   VD25OH 17 (L) 04/05/2021   The 10-year ASCVD risk score (Arnett DK, et al., 2019) is: 13.1%    Assessment & Plan:   Problem List Items Addressed This Visit       Essential hypertension    She is currently  prescribed lisinopril-HCTZ 20-25 mg daily for treatment of hypertension.  She was previously prescribed amlodipine 2.5 mg daily, however this was discontinued at her last appointment  she had not been taking amlodipine for several months and her blood pressure was adequately controlled.  Her blood pressure today was 170/78 initially and 176/76 on repeat.  He has been checking her blood pressure at home and reports systolic readings ranging 150-170 mmHg. -Start amlodipine 5 mg daily today -Continue lisinopril-HCTZ at current dose -Follow-up in 4 weeks for HTN      Mixed hyperlipidemia    Lipid panel updated in late December.  Total cholesterol 193 and LDL 118.  Her 10-year ASCVD risk for today is 13.1%.  She had previously stopped taking atorvastatin 20 mg daily, but this was restarted at her last appointment in November. -Increase atorvastatin to 40 mg daily as LDL remains above goal ( < 90) -Follow-up in 4 weeks      Vitamin D deficiency - Primary    History of vitamin D deficiency, last vitamin D level updated in September 2022 (17).  She is not currently on vitamin D supplementation. -Repeat vitamin D level ordered today      Pneumococcal vaccination declined    She has declined PCV 20 today, noting that has developed pneumonia with prior vaccinations.      Screening for colon cancer    Cologuard has been ordered today for colorectal cancer screening      Return in about 4 weeks (around 09/05/2022) for HTN, HLD.    Johnette Abraham, MD

## 2022-08-08 NOTE — Assessment & Plan Note (Signed)
She has declined PCV 20 today, noting that has developed pneumonia with prior vaccinations.

## 2022-08-09 LAB — VITAMIN D 25 HYDROXY (VIT D DEFICIENCY, FRACTURES): Vit D, 25-Hydroxy: 10.9 ng/mL — ABNORMAL LOW (ref 30.0–100.0)

## 2022-08-14 ENCOUNTER — Other Ambulatory Visit: Payer: Self-pay | Admitting: Internal Medicine

## 2022-08-14 DIAGNOSIS — E559 Vitamin D deficiency, unspecified: Secondary | ICD-10-CM

## 2022-08-14 MED ORDER — VITAMIN D (ERGOCALCIFEROL) 1.25 MG (50000 UNIT) PO CAPS: 50000.0000 [IU] | ORAL_CAPSULE | ORAL | 0 refills | Status: AC

## 2022-08-22 ENCOUNTER — Ambulatory Visit (INDEPENDENT_AMBULATORY_CARE_PROVIDER_SITE_OTHER): Payer: 59

## 2022-08-22 VITALS — BP 160/74 | Ht 63.0 in | Wt 135.8 lb

## 2022-08-22 DIAGNOSIS — Z0000 Encounter for general adult medical examination without abnormal findings: Secondary | ICD-10-CM

## 2022-08-22 NOTE — Patient Instructions (Addendum)
All screenings are up to date and vaccines have been declined. Let us know if you decide to get any of your vaccines. (Flu, tdap, shingrix, pneumonia) Health Maintenance, Female Adopting a healthy lifestyle and getting preventive care are important in promoting health and wellness. Ask your health care provider about: The right schedule for you to have regular tests and exams. Things you can do on your own to prevent diseases and keep yourself healthy. What should I know about diet, weight, and exercise? Eat a healthy diet  Eat a diet that includes plenty of vegetables, fruits, low-fat dairy products, and lean protein. Do not eat a lot of foods that are high in solid fats, added sugars, or sodium. Maintain a healthy weight Body mass index (BMI) is used to identify weight problems. It estimates body fat based on height and weight. Your health care provider can help determine your BMI and help you achieve or maintain a healthy weight. Get regular exercise Get regular exercise. This is one of the most important things you can do for your health. Most adults should: Exercise for at least 150 minutes each week. The exercise should increase your heart rate and make you sweat (moderate-intensity exercise). Do strengthening exercises at least twice a week. This is in addition to the moderate-intensity exercise. Spend less time sitting. Even light physical activity can be beneficial. Watch cholesterol and blood lipids Have your blood tested for lipids and cholesterol at 70 years of age, then have this test every 5 years. Have your cholesterol levels checked more often if: Your lipid or cholesterol levels are high. You are older than 70 years of age. You are at high risk for heart disease. What should I know about cancer screening? Depending on your health history and family history, you may need to have cancer screening at various ages. This may include screening for: Breast cancer. Cervical  cancer. Colorectal cancer. Skin cancer. Lung cancer. What should I know about heart disease, diabetes, and high blood pressure? Blood pressure and heart disease High blood pressure causes heart disease and increases the risk of stroke. This is more likely to develop in people who have high blood pressure readings or are overweight. Have your blood pressure checked: Every 3-5 years if you are 82-30 years of age. Every year if you are 15 years old or older. Diabetes Have regular diabetes screenings. This checks your fasting blood sugar level. Have the screening done: Once every three years after age 20 if you are at a normal weight and have a low risk for diabetes. More often and at a younger age if you are overweight or have a high risk for diabetes. What should I know about preventing infection? Hepatitis B If you have a higher risk for hepatitis B, you should be screened for this virus. Talk with your health care provider to find out if you are at risk for hepatitis B infection. Hepatitis C Testing is recommended for: Everyone born from [birth year] through 1963. Anyone with known risk factors for hepatitis C. Sexually transmitted infections (STIs) Get screened for STIs, including gonorrhea and chlamydia, if: You are sexually active and are younger than 70 years of age. You are older than 70 years of age and your health care provider tells you that you are at risk for this type of infection. Your sexual activity has changed since you were last screened, and you are at increased risk for chlamydia or gonorrhea. Ask your health care provider if you are at risk.  Ask your health care provider about whether you are at high risk for HIV. Your health care provider may recommend a prescription medicine to help prevent HIV infection. If you choose to take medicine to prevent HIV, you should first get tested for HIV. You should then be tested every 3 months for as long as you are taking the  medicine. Pregnancy If you are about to stop having your period (premenopausal) and you may become pregnant, seek counseling before you get pregnant. Take 400 to 800 micrograms (mcg) of folic acid every day if you become pregnant. Ask for birth control (contraception) if you want to prevent pregnancy. Osteoporosis and menopause Osteoporosis is a disease in which the bones lose minerals and strength with aging. This can result in bone fractures. If you are 80 years old or older, or if you are at risk for osteoporosis and fractures, ask your health care provider if you should: Be screened for bone loss. Take a calcium or vitamin D supplement to lower your risk of fractures. Be given hormone replacement therapy (HRT) to treat symptoms of menopause. Follow these instructions at home: Alcohol use Do not drink alcohol if: Your health care provider tells you not to drink. You are pregnant, may be pregnant, or are planning to become pregnant. If you drink alcohol: Limit how much you have to: 0-1 drink a day. Know how much alcohol is in your drink. In the [place] one drink equals one 12 oz bottle of beer (355 mL), one 5 oz glass of wine (148 mL), or one 1 oz glass of hard liquor (44 mL). Lifestyle Do not use any products that contain nicotine or tobacco. These products include cigarettes, chewing tobacco, and vaping devices, such as e-cigarettes. If you need help quitting, ask your health care provider. Do not use street drugs. Do not share needles. Ask your health care provider for help if you need support or information about quitting drugs. General instructions Schedule regular health, dental, and eye exams. Stay current with your vaccines. Tell your health care provider if: You often feel depressed. You have ever been abused or do not feel safe at home. Summary Adopting a healthy lifestyle and getting preventive care are important in promoting health and wellness. Follow your health care  provider's instructions about healthy diet, exercising, and getting tested or screened for diseases. Follow your health care provider's instructions on monitoring your cholesterol and blood pressure. This information is not intended to replace advice given to you by your health care provider. Make sure you discuss any questions you have with your health care provider. Document Revised: 11/30/2020 Document Reviewed: 11/30/2020 Elsevier Patient Education  [place].

## 2022-08-22 NOTE — Progress Notes (Signed)
Subjective:   Jennifer Armstrong is a 72 y.o. female who presents for Medicare Annual (Subsequent) preventive examination.  Review of Systems     Cardiac Risk Factors include: advanced age (>45men, >58 women);hypertension;dyslipidemia;smoking/ tobacco exposure     Objective:    Today's Vitals   08/22/22 0959 08/22/22 1019  BP: (!) 160/78 (!) 160/74  Weight: 135 lb 12.8 oz (61.6 kg)   Height: 5\' 3"  (1.6 m)    Body mass index is 24.06 kg/m.     08/22/2022   10:18 AM 08/13/2021   12:29 PM 10/23/2017    6:38 PM  Advanced Directives  Does Patient Have a Medical Advance Directive? No No No  Would patient like information on creating a medical advance directive? Yes (ED - Information included in AVS) No - Patient declined No - Patient declined    Current Medications (verified) Outpatient Encounter Medications as of 08/22/2022  Medication Sig   amLODipine (NORVASC) 5 MG tablet Take 1 tablet (5 mg total) by mouth daily.   atorvastatin (LIPITOR) 40 MG tablet Take 1 tablet (40 mg total) by mouth daily.   lisinopril-hydrochlorothiazide (ZESTORETIC) 20-25 MG tablet Take 1 tablet by mouth daily.   Vitamin D, Ergocalciferol, (DRISDOL) 1.25 MG (50000 UNIT) CAPS capsule Take 1 capsule (50,000 Units total) by mouth every 7 (seven) days for 12 doses.   No facility-administered encounter medications on file as of 08/22/2022.    Allergies (verified) Patient has no known allergies.   History: Past Medical History:  Diagnosis Date   Hyperlipidemia    Hypertension    Knee pain, right    Tibial plateau fracture    Past Surgical History:  Procedure Laterality Date   ABDOMINAL HYSTERECTOMY     KNEE SURGERY     Family History  Problem Relation Age of Onset   Cancer Mother    Heart attack Father    Sickle cell trait Son    Sickle cell trait Son    Sickle cell anemia Daughter    Social History   Socioeconomic History   Marital status: Single    Spouse name: Not on file   Number of  children: 2   Years of education: Not on file   Highest education level: Not on file  Occupational History   Not on file  Tobacco Use   Smoking status: Former    Packs/day: 0.50    Types: Cigarettes    Start date: 07/25/1977    Quit date: 12/2021    Years since quitting: 0.6   Smokeless tobacco: Never   Tobacco comments:    Used to smoke almost a pack now cut back to 0.5pack a day , has been smoking for over 50 years.   Vaping Use   Vaping Use: Never used  Substance and Sexual Activity   Alcohol use: Not Currently   Drug use: Never   Sexual activity: Yes  Other Topics Concern   Not on file  Social History Narrative   Lives with her son .    Social Determinants of Health   Financial Resource Strain: Low Risk  (08/22/2022)   Overall Financial Resource Strain (CARDIA)    Difficulty of Paying Living Expenses: Not very hard  Food Insecurity: No Food Insecurity (08/22/2022)   Hunger Vital Sign    Worried About Running Out of Food in the Last Year: Never true    Ran Out of Food in the Last Year: Never true  Transportation Needs: No Transportation Needs (08/22/2022)  PRAPARE - Hydrologist (Medical): No    Lack of Transportation (Non-Medical): No  Physical Activity: Sufficiently Active (08/22/2022)   Exercise Vital Sign    Days of Exercise per Week: 7 days    Minutes of Exercise per Session: 50 min  Stress: Stress Concern Present (08/22/2022)   [place]isa    Feeling of Stress : To some extent  Social Connections: Moderately Isolated (08/22/2022)   Social Connection and Isolation Panel [NHANES]    Frequency of Communication with Friends and Family: Once a week    Frequency of Social Gatherings with Friends and Family: Three times a week    Attends Religious Services: More than 4 times per year    Active Member of Clubs or Organizations: No    Attends Archivist Meetings: Never     Marital Status: Never married    Tobacco Counseling Counseling given: Not Answered Tobacco comments: Used to smoke almost a pack now cut back to 0.5pack a day , has been smoking for over 50 years.    Clinical Intake:  Pre-visit preparation completed: No  Pain : No/denies pain     Nutritional Status: BMI of 19-24  Normal Diabetes: No  How often do you need to have someone help you when you read instructions, pamphlets, or other written materials from your doctor or pharmacy?: 1 - Never What is the last grade level you completed in school?: 12th grade  Diabetic?no  Interpreter Needed?: No      Activities of Daily Living    08/22/2022   10:07 AM  In your present state of health, do you have any difficulty performing the following activities:  Hearing? 0  Vision? 0  Difficulty concentrating or making decisions? 0  Walking or climbing stairs? 0  Dressing or bathing? 0  Doing errands, shopping? 0  Preparing Food and eating ? N  Using the Toilet? N  In the past six months, have you accidently leaked urine? N  Do you have problems with loss of bowel control? N  Managing your Medications? N  Managing your Finances? N  Housekeeping or managing your Housekeeping? N    Patient Care Team: Johnette Abraham, MD as PCP - General (Internal Medicine)  Indicate any recent Medical Services you may have received from other than [facility] providers in the past year (date may be approximate).     Assessment:   This is a routine wellness examination for Lebam.  Hearing/Vision screen No results found.  Dietary issues and exercise activities discussed: Current Exercise Habits: Home exercise routine, Type of exercise: walking, Time (Minutes): 50, Frequency (Times/Week): 7, Weekly Exercise (Minutes/Week): 350, Intensity: Mild   Goals Addressed             This Visit's Progress    Blood Pressure < 140/90   160/74    Prevent falls         Depression Screen    08/22/2022    10:07 AM 08/08/2022    8:53 AM 06/06/2022    8:28 AM 05/09/2022    8:06 AM 01/27/2022    8:28 AM 12/28/2021    8:05 AM 11/12/2021    2:25 PM  PHQ 2/9 Scores  PHQ - 2 Score 0 0 0 0 0 0 0  PHQ- 9 Score  0         Fall Risk    08/22/2022   10:07 AM 08/08/2022  8:53 AM 06/06/2022    8:28 AM 05/09/2022    8:06 AM 01/27/2022    8:28 AM  Fall Risk   Falls in the past year? 0 0 0 0 0  Number falls in past yr: 0 0 0 0 0  Injury with Fall? 0 0 0 0 0  Risk for fall due to :  No Fall Risks No Fall Risks No Fall Risks No Fall Risks  Follow up  Falls evaluation completed Falls evaluation completed Falls evaluation completed Falls evaluation completed    FALL RISK PREVENTION PERTAINING TO THE HOME:  Any stairs in or around the home? No  If so, are there any without handrails? No  Home free of loose throw rugs in walkways, pet beds, electrical cords, etc? Yes  Adequate lighting in your home to reduce risk of falls? Yes   ASSISTIVE DEVICES UTILIZED TO PREVENT FALLS:  Life alert? No  Use of a cane, walker or w/c? No  Grab bars in the bathroom? No  Shower chair or bench in shower? No  Elevated toilet seat or a handicapped toilet? No   TIMED UP AND GO:  Was the test performed? Yes .  Length of time to ambulate 10 feet: 8 sec.   Gait steady and fast without use of assistive device  Cognitive Function:        Immunizations  There is no immunization history on file for this patient.  TDAP status: Due, Education has been provided regarding the importance of this vaccine. Advised may receive this vaccine at local pharmacy or Health Dept. Aware to provide a copy of the vaccination record if obtained from local pharmacy or Health Dept. Verbalized acceptance and understanding.  Flu Vaccine status: Up to date declined  Pneumococcal vaccine status: Declined,  Education has been provided regarding the importance of this vaccine but patient still declined. Advised may receive this vaccine at  local pharmacy or Health Dept. Aware to provide a copy of the vaccination record if obtained from local pharmacy or Health Dept. Verbalized acceptance and understanding.   Covid-19 vaccine status: Declined, Education has been provided regarding the importance of this vaccine but patient still declined. Advised may receive this vaccine at local pharmacy or Health Dept.or vaccine clinic. Aware to provide a copy of the vaccination record if obtained from local pharmacy or Health Dept. Verbalized acceptance and understanding.  Qualifies for Shingles Vaccine? Yes   Zostavax completed No   Shingrix Completed?: No.    Education has been provided regarding the importance of this vaccine. Patient has been advised to call insurance company to determine out of pocket expense if they have not yet received this vaccine. Advised may also receive vaccine at local pharmacy or Health Dept. Verbalized acceptance and understanding.  Screening Tests Health Maintenance  Topic Date Due   DTaP/Tdap/Td (1 - Tdap) Never done   Fecal DNA (Cologuard)  08/27/2022 (Originally 06/20/1998)   Zoster Vaccines- Shingrix (1 of 2) 09/06/2022 (Originally 06/21/2003)   INFLUENZA VACCINE  10/23/2022 (Originally 02/22/2022)   Pneumonia Vaccine 73+ Years old (1 - PCV) 08/09/2023 (Originally 06/20/2018)   MAMMOGRAM  03/16/2023   Medicare Annual Wellness (AWV)  08/23/2023   DEXA SCAN  Completed   Hepatitis C Screening  Completed   HPV VACCINES  Aged Out   COVID-19 Vaccine  Discontinued    Health Maintenance  Health Maintenance Due  Topic Date Due   DTaP/Tdap/Td (1 - Tdap) Never done    Colorectal cancer screening: Type of  screening: Cologuard. Completed sent specimen in waiting on results. Repeat every 3 years  Mammogram status: Completed yes. Repeat every year  Bone denisty- Completed 02/2021. Osteoporosis. Repeat 2 years Lung Cancer Screening: (Low Dose CT Chest recommended if Age 42-80 years, 30 pack-year currently smoking  OR have quit w/in 15years.) does not qualify.   Lung Cancer Screening Referral: na  Additional Screening:  Hepatitis C Screening: does not qualify; Completed yes  Vision Screening: Recommended annual ophthalmology exams for early detection of glaucoma and other disorders of the eye. Is the patient up to date with their annual eye exam?  Yes  Who is the provider or what is the name of the office in which the patient attends annual eye exams? [facility] provider If pt is not established with a provider, would they like to be referred to a provider to establish care? No .   Dental Screening: Recommended annual dental exams for proper oral hygiene  Community Resource Referral / Chronic Care Management: CRR required this visit?  No   CCM required this visit?  No      Plan:     I have personally reviewed and noted the following in the patient's chart:   Medical and social history Use of alcohol, tobacco or illicit drugs  Current medications and supplements including opioid prescriptions. Patient is not currently taking opioid prescriptions. Functional ability and status Nutritional status Physical activity Advanced directives List of other physicians Hospitalizations, surgeries, and ER visits in previous 12 months Vitals Screenings to include cognitive, depression, and falls Referrals and appointments  In addition, I have reviewed and discussed with patient certain preventive protocols, quality metrics, and best practice recommendations. A written personalized care plan for preventive services as well as general preventive health recommendations were provided to patient.     Eual Fines, LPN   02/16/3663   Nurse Notes: Schedule your next wellness visit for 1 year at checkout

## 2022-09-05 ENCOUNTER — Ambulatory Visit: Payer: 59 | Admitting: Internal Medicine

## 2022-09-13 ENCOUNTER — Ambulatory Visit: Payer: 59 | Admitting: Internal Medicine

## 2022-09-29 ENCOUNTER — Ambulatory Visit (INDEPENDENT_AMBULATORY_CARE_PROVIDER_SITE_OTHER): Payer: 59 | Admitting: Internal Medicine

## 2022-09-29 ENCOUNTER — Encounter: Payer: Self-pay | Admitting: Internal Medicine

## 2022-09-29 VITALS — BP 131/72 | HR 75 | Ht 63.0 in | Wt 137.6 lb

## 2022-09-29 DIAGNOSIS — I10 Essential (primary) hypertension: Secondary | ICD-10-CM

## 2022-09-29 DIAGNOSIS — E559 Vitamin D deficiency, unspecified: Secondary | ICD-10-CM

## 2022-09-29 DIAGNOSIS — E782 Mixed hyperlipidemia: Secondary | ICD-10-CM

## 2022-09-29 NOTE — Assessment & Plan Note (Signed)
Lipid panel last updated in December 2023.  Atorvastatin was increased to 40 mg daily at her last appointment his LDL remained above goal (<90).  She has not experienced any adverse side effects with increasing atorvastatin. -No medication changes today.

## 2022-09-29 NOTE — Assessment & Plan Note (Signed)
Noted on labs from January.  She is currently on high-dose, weekly vitamin D supplementation. -Repeat vitamin D level upon completion of high-dose vitamin D supplementation.

## 2022-09-29 NOTE — Assessment & Plan Note (Signed)
Presenting today for HTN follow-up.  Amlodipine 5 mg daily was added for improved treatment of HTN at her last appointment.  She is additionally prescribed lisinopril-HCTZ 20-25 mg daily.  BP today is 131/72. -No additional medication changes today.

## 2022-09-29 NOTE — Progress Notes (Signed)
Established Patient Office Visit  Subjective   Patient ID: Jennifer Armstrong, female    DOB: Dec 18, 1950  Age: 72 y.o. MRN: [id number]  Chief Complaint  Patient presents with   Hypertension    Follow up   Hyperlipidemia    Follow up   Jennifer Armstrong returns to care today for HTN and HLD follow-up.  She was last seen by me on 1/15 at which time amlodipine 5 mg daily was added for improved treatment of hypertension.  Atorvastatin was increased to 40 mg daily as LDL remained above goal.  There have been no acute interval events.  Jennifer Armstrong reports feeling well today.  She is asymptomatic and has no additional concerns to discuss.  Past Medical History:  Diagnosis Date   Hyperlipidemia    Hypertension    Knee pain, right    Tibial plateau fracture    Past Surgical History:  Procedure Laterality Date   ABDOMINAL HYSTERECTOMY     KNEE SURGERY     Social History   Tobacco Use   Smoking status: Former    Packs/day: 0.50    Types: Cigarettes    Start date: 07/25/1977    Quit date: 12/2021    Years since quitting: 0.7   Smokeless tobacco: Never   Tobacco comments:    Used to smoke almost a pack now cut back to 0.5pack a day , has been smoking for over 50 years.   Vaping Use   Vaping Use: Never used  Substance Use Topics   Alcohol use: Not Currently   Drug use: Never   Family History  Problem Relation Age of Onset   Cancer Mother    Heart attack Father    Sickle cell trait Son    Sickle cell trait Son    Sickle cell anemia Daughter    No Known Allergies  Review of Systems  Constitutional:  Negative for chills and fever.  HENT:  Negative for sore throat.   Respiratory:  Negative for cough and shortness of breath.   Cardiovascular:  Negative for chest pain, palpitations and leg swelling.  Gastrointestinal:  Negative for abdominal pain, blood in stool, constipation, diarrhea, nausea and vomiting.  Genitourinary:  Negative for dysuria and hematuria.  Musculoskeletal:   Negative for myalgias.  Skin:  Negative for itching and rash.  Neurological:  Negative for dizziness and headaches.  Psychiatric/Behavioral:  Negative for depression and suicidal ideas.      Objective:     BP 131/72   Pulse 75   Ht '5\' 3"'$  (1.6 m)   Wt 137 lb 9.6 oz (62.4 kg)   SpO2 95%   BMI 24.37 kg/m  BP Readings from Last 3 Encounters:  09/29/22 131/72  08/22/22 (!) 160/74  08/08/22 (!) 140/60   Physical Exam Vitals reviewed.  Constitutional:      General: She is not in acute distress.    Appearance: Normal appearance. She is not toxic-appearing.  HENT:     Head: Normocephalic and atraumatic.     Right Ear: External ear normal.     Left Ear: External ear normal.     Nose: Nose normal. No congestion or rhinorrhea.     Mouth/Throat:     Mouth: Mucous membranes are moist.     Pharynx: Oropharynx is clear. No oropharyngeal exudate or posterior oropharyngeal erythema.  Eyes:     General: No scleral icterus.    Extraocular Movements: Extraocular movements intact.     Conjunctiva/sclera: Conjunctivae normal.  Pupils: Pupils are equal, round, and reactive to light.  Cardiovascular:     Rate and Rhythm: Normal rate and regular rhythm.     Pulses: Normal pulses.     Heart sounds: Normal heart sounds. No murmur heard.    No friction rub. No gallop.  Pulmonary:     Effort: Pulmonary effort is normal.     Breath sounds: Normal breath sounds. No wheezing, rhonchi or rales.  Abdominal:     General: Abdomen is flat. Bowel sounds are normal. There is no distension.     Palpations: Abdomen is soft.     Tenderness: There is no abdominal tenderness.  Musculoskeletal:        General: No swelling. Normal range of motion.     Cervical back: Normal range of motion.     Right lower leg: No edema.     Left lower leg: No edema.  Lymphadenopathy:     Cervical: No cervical adenopathy.  Skin:    General: Skin is warm and dry.     Capillary Refill: Capillary refill takes less than 2  seconds.     Coloration: Skin is not jaundiced.  Neurological:     General: No focal deficit present.     Mental Status: She is alert and oriented to person, place, and time.  Psychiatric:        Mood and Affect: Mood normal.        Behavior: Behavior normal.   Last CBC Lab Results  Component Value Date   WBC 4.9 08/27/2021   HGB 11.7 08/27/2021   HCT 35.2 08/27/2021   MCV 94 08/27/2021   MCH 31.3 08/27/2021   RDW 11.9 08/27/2021   PLT 247 [id number]   Last metabolic panel Lab Results  Component Value Date   GLUCOSE 88 01/17/2022   NA 142 01/17/2022   K 4.3 01/17/2022   CL 105 01/17/2022   CO2 24 01/17/2022   BUN 12 01/17/2022   CREATININE 0.85 01/17/2022   EGFR 75 01/17/2022   CALCIUM 9.3 01/17/2022   PROT 7.2 04/05/2021   ALBUMIN 3.8 11/22/2010   BILITOT 0.4 04/05/2021   ALKPHOS 61 11/22/2010   AST 14 04/05/2021   ALT 10 04/05/2021   Last lipids Lab Results  Component Value Date   CHOL 193 07/22/2022   HDL 63 07/22/2022   LDLCALC 118 (H) 07/22/2022   TRIG 66 07/22/2022   CHOLHDL 3.1 07/22/2022   Last hemoglobin A1c Lab Results  Component Value Date   HGBA1C 5.4 03/08/2021   Last thyroid functions Lab Results  Component Value Date   TSH 3.26 03/08/2021   Last vitamin D Lab Results  Component Value Date   VD25OH 10.9 (L) 08/08/2022   The 10-year ASCVD risk score (Arnett DK, et al., 2019) is: 11.6%    Assessment & Plan:   Problem List Items Addressed This Visit       Essential hypertension - Primary    Presenting today for HTN follow-up.  Amlodipine 5 mg daily was added for improved treatment of HTN at her last appointment.  She is additionally prescribed lisinopril-HCTZ 20-25 mg daily.  BP today is 131/72. -No additional medication changes today.      Mixed hyperlipidemia    Lipid panel last updated in December 2023.  Atorvastatin was increased to 40 mg daily at her last appointment his LDL remained above goal (<90).  She has not  experienced any adverse side effects with increasing atorvastatin. -No medication changes today.  Vitamin D deficiency    Noted on labs from January.  She is currently on high-dose, weekly vitamin D supplementation. -Repeat vitamin D level upon completion of high-dose vitamin D supplementation.       Return in about 3 months (around 12/30/2022) for CPE.    Johnette Abraham, MD

## 2022-09-29 NOTE — Patient Instructions (Signed)
It was a pleasure to see you today.  Thank you for giving [place] the opportunity to be involved in your care.  Below is a brief recap of your visit and next steps.  We will plan to see you again in 3 months.  Summary No medication changes today. We will plan for follow up in 3 months for your annual exam.

## 2022-11-08 ENCOUNTER — Other Ambulatory Visit: Payer: Self-pay | Admitting: Internal Medicine

## 2022-11-08 DIAGNOSIS — E559 Vitamin D deficiency, unspecified: Secondary | ICD-10-CM

## 2022-12-30 ENCOUNTER — Encounter: Payer: Self-pay | Admitting: Internal Medicine

## 2022-12-30 ENCOUNTER — Ambulatory Visit (INDEPENDENT_AMBULATORY_CARE_PROVIDER_SITE_OTHER): Payer: 59 | Admitting: Internal Medicine

## 2022-12-30 VITALS — BP 149/73 | HR 67 | Ht 63.0 in | Wt 144.4 lb

## 2022-12-30 DIAGNOSIS — Z0001 Encounter for general adult medical examination with abnormal findings: Secondary | ICD-10-CM

## 2022-12-30 DIAGNOSIS — Z139 Encounter for screening, unspecified: Secondary | ICD-10-CM | POA: Diagnosis not present

## 2022-12-30 DIAGNOSIS — M810 Age-related osteoporosis without current pathological fracture: Secondary | ICD-10-CM | POA: Diagnosis not present

## 2022-12-30 DIAGNOSIS — I10 Essential (primary) hypertension: Secondary | ICD-10-CM

## 2022-12-30 DIAGNOSIS — N182 Chronic kidney disease, stage 2 (mild): Secondary | ICD-10-CM | POA: Diagnosis not present

## 2022-12-30 DIAGNOSIS — Z1211 Encounter for screening for malignant neoplasm of colon: Secondary | ICD-10-CM

## 2022-12-30 DIAGNOSIS — E559 Vitamin D deficiency, unspecified: Secondary | ICD-10-CM

## 2022-12-30 DIAGNOSIS — E782 Mixed hyperlipidemia: Secondary | ICD-10-CM | POA: Diagnosis not present

## 2022-12-30 MED ORDER — ATORVASTATIN CALCIUM 40 MG PO TABS: 40.0000 mg | ORAL_TABLET | Freq: Every day | ORAL | 2 refills | Status: DC

## 2022-12-30 MED ORDER — LISINOPRIL-HYDROCHLOROTHIAZIDE 20-25 MG PO TABS: 1.0000 | ORAL_TABLET | Freq: Every day | ORAL | 3 refills | Status: DC

## 2022-12-30 MED ORDER — AMLODIPINE BESYLATE 5 MG PO TABS: 5.0000 mg | ORAL_TABLET | Freq: Every day | ORAL | 2 refills | Status: DC

## 2022-12-30 NOTE — Assessment & Plan Note (Signed)
Chronic issue.  On ARB.  Repeat labs ordered today.

## 2022-12-30 NOTE — Assessment & Plan Note (Signed)
Currently prescribed antihypertensive medication regimen consists of amlodipine 5 mg daily and lisinopril-HCTZ 20-25 mg daily.  BP is elevated today, 161/74 initially and 149/73 on repeat.  She has previously been well-controlled on this regimen.  On further discussion, she has not been taking amlodipine recently. -Medications refilled today -Follow-up in 4 weeks for HTN check

## 2022-12-30 NOTE — Assessment & Plan Note (Signed)
Currently prescribed atorvastatin 40 mg daily.  Lipid panel updated in December. -Repeat lipid panel ordered today

## 2022-12-30 NOTE — Assessment & Plan Note (Signed)
Presenting today for annual exam.  Recent records and labs have been reviewed. -Repeat labs ordered today -Colon cancer screening discussed.  She will consider a referral to gastroenterology for screening colonoscopy.  Plan to further discuss at follow-up in 4 weeks. -Recommended that she complete outstanding Tdap and zoster vaccinations at her pharmacy. -Follow-up in 4 weeks for HTN check and lab review.

## 2022-12-30 NOTE — Assessment & Plan Note (Signed)
Noted on labs from January.  She has completed high-dose, weekly vitamin D supplementation. -Repeat vitamin D level ordered today 

## 2022-12-30 NOTE — Assessment & Plan Note (Signed)
T-score -2.6 on DEXA from 2022. -Repeat vitamin D and calcium levels today.  If adequate, consider adding bisphosphonate therapy.

## 2022-12-30 NOTE — Patient Instructions (Signed)
It was a pleasure to see you today.  Thank you for giving [place] the opportunity to be involved in your care.  Below is a brief recap of your visit and next steps.  We will plan to see you again in 4 weeks.  Summary Annual exam completed today Repeat labs ordered Medications refilled Follow up in 4 weeks for blood pressure check

## 2022-12-30 NOTE — Assessment & Plan Note (Signed)
Cologuard previously ordered for colorectal cancer screening.  This has not been completed.  States that she has the kit at home but likely will not complete it.  She will consider a referral to gastroenterology for screening colonoscopy. -Follow-up in 4 weeks

## 2022-12-30 NOTE — Progress Notes (Signed)
Complete physical exam  Patient: Jennifer Armstrong   DOB: 05-Mar-1951   72 y.o. Female  MRN: [id number]  Subjective:    Chief Complaint  Patient presents with   Annual Exam    Jennifer Armstrong is a 72 y.o. female who presents today for a complete physical exam. She reports consuming a general diet. Home exercise routine includes walking 4 hrs per week. She generally feels well. She reports sleeping well. She does not have additional problems to discuss today.   Most recent fall risk assessment:    12/30/2022    9:04 AM  Fall Risk   Falls in the past year? 0  Number falls in past yr: 0  Injury with Fall? 0  Risk for fall due to : No Fall Risks  Follow up Falls evaluation completed     Most recent depression screenings:    12/30/2022    9:04 AM 09/29/2022    9:12 AM  PHQ 2/9 Scores  PHQ - 2 Score 0 0  PHQ- 9 Score 0 0    Vision:Not within last year  and Dental: No current dental problems and Receives regular dental care  Past Medical History:  Diagnosis Date   Hyperlipidemia    Hypertension    Knee pain, right    Tibial plateau fracture    Past Surgical History:  Procedure Laterality Date   ABDOMINAL HYSTERECTOMY     KNEE SURGERY     Social History   Tobacco Use   Smoking status: Former    Packs/day: .5    Types: Cigarettes    Start date: 07/25/1977    Quit date: 12/2021    Years since quitting: 1.0   Smokeless tobacco: Never   Tobacco comments:    Used to smoke almost a pack now cut back to 0.5pack a day , has been smoking for over 50 years.   Vaping Use   Vaping Use: Never used  Substance Use Topics   Alcohol use: Not Currently   Drug use: Never   Family History  Problem Relation Age of Onset   Cancer Mother    Heart attack Father    Sickle cell trait Son    Sickle cell trait Son    Sickle cell anemia Daughter    No Known Allergies    Patient Care Team: Billie Lade, MD as PCP - General (Internal Medicine)   Outpatient Medications Prior to  Visit  Medication Sig   [DISCONTINUED] amLODipine (NORVASC) 5 MG tablet Take 1 tablet (5 mg total) by mouth daily.   [DISCONTINUED] atorvastatin (LIPITOR) 40 MG tablet Take 1 tablet (40 mg total) by mouth daily.   [DISCONTINUED] lisinopril-hydrochlorothiazide (ZESTORETIC) 20-25 MG tablet Take 1 tablet by mouth daily.   No facility-administered medications prior to visit.   Review of Systems  Constitutional:  Negative for chills and fever.  HENT:  Negative for sore throat.   Respiratory:  Negative for cough and shortness of breath.   Cardiovascular:  Negative for chest pain, palpitations and leg swelling.  Gastrointestinal:  Negative for abdominal pain, blood in stool, constipation, diarrhea, nausea and vomiting.  Genitourinary:  Negative for dysuria and hematuria.  Musculoskeletal:  Negative for myalgias.  Skin:  Negative for itching and rash.  Neurological:  Negative for dizziness and headaches.  Psychiatric/Behavioral:  Negative for depression and suicidal ideas.       Objective:     BP (!) 149/73   Pulse 67   Ht 5\' 3"  (1.6  m)   Wt 144 lb 6.4 oz (65.5 kg)   SpO2 95%   BMI 25.58 kg/m  BP Readings from Last 3 Encounters:  12/30/22 (!) 149/73  09/29/22 131/72  08/22/22 (!) 160/74   Physical Exam Vitals reviewed.  Constitutional:      General: She is not in acute distress.    Appearance: Normal appearance. She is not toxic-appearing.  HENT:     Head: Normocephalic and atraumatic.     Right Ear: External ear normal.     Left Ear: External ear normal.     Nose: Nose normal. No congestion or rhinorrhea.     Mouth/Throat:     Mouth: Mucous membranes are moist.     Pharynx: Oropharynx is clear. No oropharyngeal exudate or posterior oropharyngeal erythema.  Eyes:     General: No scleral icterus.    Extraocular Movements: Extraocular movements intact.     Conjunctiva/sclera: Conjunctivae normal.     Pupils: Pupils are equal, round, and reactive to light.  Cardiovascular:      Rate and Rhythm: Normal rate and regular rhythm.     Pulses: Normal pulses.     Heart sounds: Normal heart sounds. No murmur heard.    No friction rub. No gallop.  Pulmonary:     Effort: Pulmonary effort is normal.     Breath sounds: Normal breath sounds. No wheezing, rhonchi or rales.  Abdominal:     General: Abdomen is flat. Bowel sounds are normal. There is no distension.     Palpations: Abdomen is soft.     Tenderness: There is no abdominal tenderness.  Musculoskeletal:        General: No swelling. Normal range of motion.     Cervical back: Normal range of motion.     Right lower leg: No edema.     Left lower leg: No edema.  Lymphadenopathy:     Cervical: No cervical adenopathy.  Skin:    General: Skin is warm and dry.     Capillary Refill: Capillary refill takes less than 2 seconds.     Coloration: Skin is not jaundiced.  Neurological:     General: No focal deficit present.     Mental Status: She is alert and oriented to person, place, and time.  Psychiatric:        Mood and Affect: Mood normal.        Behavior: Behavior normal.   Last CBC Lab Results  Component Value Date   WBC 4.9 08/27/2021   HGB 11.7 08/27/2021   HCT 35.2 08/27/2021   MCV 94 08/27/2021   MCH 31.3 08/27/2021   RDW 11.9 08/27/2021   PLT 247 08/27/2021   Last metabolic panel Lab Results  Component Value Date   GLUCOSE 88 01/17/2022   NA 142 01/17/2022   K 4.3 01/17/2022   CL 105 01/17/2022   CO2 24 01/17/2022   BUN 12 01/17/2022   CREATININE 0.85 01/17/2022   EGFR 75 01/17/2022   CALCIUM 9.3 01/17/2022   PROT 7.2 04/05/2021   ALBUMIN 3.8 11/22/2010   BILITOT 0.4 04/05/2021   ALKPHOS 61 11/22/2010   AST 14 04/05/2021   ALT 10 04/05/2021   Last lipids Lab Results  Component Value Date   CHOL 193 07/22/2022   HDL 63 07/22/2022   LDLCALC 118 (H) 07/22/2022   TRIG 66 07/22/2022   CHOLHDL 3.1 07/22/2022   Last hemoglobin A1c Lab Results  Component Value Date   HGBA1C 5.4  03/08/2021  Last thyroid functions Lab Results  Component Value Date   TSH 3.26 03/08/2021   Last vitamin D Lab Results  Component Value Date   VD25OH 10.9 (L) 08/08/2022       Assessment & Plan:    Routine Health Maintenance and Physical Exam   There is no immunization history on file for this patient.  Health Maintenance  Topic Date Due   DTaP/Tdap/Td (1 - Tdap) Never done   Fecal DNA (Cologuard)  Never done   Zoster Vaccines- Shingrix (1 of 2) Never done   Pneumonia Vaccine 48+ Years old (1 of 1 - PCV) 08/09/2023 (Originally 06/20/2018)   INFLUENZA VACCINE  02/23/2023   MAMMOGRAM  03/16/2023   Medicare Annual Wellness (AWV)  08/23/2023   DEXA SCAN  Completed   Hepatitis C Screening  Completed   HPV VACCINES  Aged Out   COVID-19 Vaccine  Discontinued    Discussed health benefits of physical activity, and encouraged her to engage in regular exercise appropriate for her age and condition.  Problem List Items Addressed This Visit       Essential hypertension    Currently prescribed antihypertensive medication regimen consists of amlodipine 5 mg daily and lisinopril-HCTZ 20-25 mg daily.  BP is elevated today, 161/74 initially and 149/73 on repeat.  She has previously been well-controlled on this regimen.  On further discussion, she has not been taking amlodipine recently. -Medications refilled today -Follow-up in 4 weeks for HTN check      Age-related osteoporosis without current pathological fracture    T-score -2.6 on DEXA from 2022. -Repeat vitamin D and calcium levels today.  If adequate, consider adding bisphosphonate therapy.      CKD (chronic kidney disease), stage II    Chronic issue.  On ARB.  Repeat labs ordered today.      Mixed hyperlipidemia    Currently prescribed atorvastatin 40 mg daily.  Lipid panel updated in December. -Repeat lipid panel ordered today      Vitamin D deficiency - Primary    Noted on labs from January.  She has completed  high-dose, weekly vitamin D supplementation. -Repeat vitamin D level ordered today      Screening for colon cancer    Cologuard previously ordered for colorectal cancer screening.  This has not been completed.  States that she has the kit at home but likely will not complete it.  She will consider a referral to gastroenterology for screening colonoscopy. -Follow-up in 4 weeks      Encounter for well adult exam with abnormal findings    Presenting today for annual exam.  Recent records and labs have been reviewed. -Repeat labs ordered today -Colon cancer screening discussed.  She will consider a referral to gastroenterology for screening colonoscopy.  Plan to further discuss at follow-up in 4 weeks. -Recommended that she complete outstanding Tdap and zoster vaccinations at her pharmacy. -Follow-up in 4 weeks for HTN check and lab review.      Return in about 4 weeks (around 01/27/2023) for HTN, lab review.  Billie Lade, MD

## 2022-12-31 LAB — CBC WITH DIFFERENTIAL/PLATELET
Basophils Absolute: 0 10*3/uL (ref 0.0–0.2)
Basos: 1 %
EOS (ABSOLUTE): 0.1 10*3/uL (ref 0.0–0.4)
Eos: 1 %
Hematocrit: 36 % (ref 34.0–46.6)
Hemoglobin: 12.1 g/dL (ref 11.1–15.9)
Immature Grans (Abs): 0 10*3/uL (ref 0.0–0.1)
Immature Granulocytes: 0 %
Lymphocytes Absolute: 1.8 10*3/uL (ref 0.7–3.1)
Lymphs: 28 %
MCH: 31.3 pg (ref 26.6–33.0)
MCHC: 33.6 g/dL (ref 31.5–35.7)
MCV: 93 fL (ref 79–97)
Monocytes Absolute: 0.5 10*3/uL (ref 0.1–0.9)
Monocytes: 7 %
Neutrophils Absolute: 4.1 10*3/uL (ref 1.4–7.0)
Neutrophils: 63 %
Platelets: 217 10*3/uL (ref 150–450)
RBC: 3.87 x10E6/uL (ref 3.77–5.28)
RDW: 12.8 % (ref 11.7–15.4)
WBC: 6.5 10*3/uL (ref 3.4–10.8)

## 2022-12-31 LAB — CMP14+EGFR
ALT: 10 IU/L (ref 0–32)
AST: 10 IU/L (ref 0–40)
Albumin/Globulin Ratio: 1.4 (ref 1.2–2.2)
Albumin: 4 g/dL (ref 3.9–4.9)
Alkaline Phosphatase: 82 IU/L (ref 44–121)
BUN/Creatinine Ratio: 14 (ref 12–28)
BUN: 13 mg/dL (ref 8–27)
Bilirubin Total: 0.3 mg/dL (ref 0.0–1.2)
CO2: 24 mmol/L (ref 20–29)
Calcium: 9.6 mg/dL (ref 8.7–10.3)
Chloride: 106 mmol/L (ref 96–106)
Creatinine, Ser: 0.91 mg/dL (ref 0.57–1.00)
Globulin, Total: 2.8 g/dL (ref 1.5–4.5)
Glucose: 90 mg/dL (ref 70–99)
Potassium: 4.2 mmol/L (ref 3.5–5.2)
Sodium: 142 mmol/L (ref 134–144)
Total Protein: 6.8 g/dL (ref 6.0–8.5)
eGFR: 68 mL/min/{1.73_m2} (ref >59)

## 2022-12-31 LAB — LIPID PANEL
Chol/HDL Ratio: 2.3 ratio (ref 0.0–4.4)
Cholesterol, Total: 165 mg/dL (ref 100–199)
HDL: 72 mg/dL (ref >39)
LDL Chol Calc (NIH): 83 mg/dL (ref 0–99)
Triglycerides: 50 mg/dL (ref 0–149)
VLDL Cholesterol Cal: 10 mg/dL (ref 5–40)

## 2022-12-31 LAB — TSH+FREE T4
Free T4: 1.09 ng/dL (ref 0.82–1.77)
TSH: 3.66 u[IU]/mL (ref 0.450–4.500)

## 2022-12-31 LAB — B12 AND FOLATE PANEL
Folate: 9.2 ng/mL (ref >3.0)
Vitamin B-12: 233 pg/mL (ref 232–1245)

## 2022-12-31 LAB — VITAMIN D 25 HYDROXY (VIT D DEFICIENCY, FRACTURES): Vit D, 25-Hydroxy: 32.6 ng/mL (ref 30.0–100.0)

## 2022-12-31 LAB — HEMOGLOBIN A1C
Est. average glucose Bld gHb Est-mCnc: 123 mg/dL
Hgb A1c MFr Bld: 5.9 % — ABNORMAL HIGH (ref 4.8–5.6)

## 2023-01-27 ENCOUNTER — Ambulatory Visit (INDEPENDENT_AMBULATORY_CARE_PROVIDER_SITE_OTHER): Payer: 59 | Admitting: Internal Medicine

## 2023-01-27 ENCOUNTER — Encounter: Payer: Self-pay | Admitting: Internal Medicine

## 2023-01-27 VITALS — BP 95/55 | HR 59 | Ht 63.0 in | Wt 139.0 lb

## 2023-01-27 DIAGNOSIS — M810 Age-related osteoporosis without current pathological fracture: Secondary | ICD-10-CM

## 2023-01-27 DIAGNOSIS — Z1211 Encounter for screening for malignant neoplasm of colon: Secondary | ICD-10-CM

## 2023-01-27 DIAGNOSIS — I10 Essential (primary) hypertension: Secondary | ICD-10-CM | POA: Diagnosis not present

## 2023-01-27 MED ORDER — OYSTER SHELL CALCIUM/D3 500-5 MG-MCG PO TABS: 1.0000 | ORAL_TABLET | Freq: Every day | ORAL | 1 refills | Status: DC

## 2023-01-27 NOTE — Assessment & Plan Note (Signed)
T-score -2.6 on DEXA from 2022.  Recently updated vitamin D and calcium levels are low-normal.  She is not currently on vitamin D and calcium supplementation. -Vitamin D + calcium supplementation has been prescribed today. -Consider addition of bisphosphonate therapy at follow-up in 3 months

## 2023-01-27 NOTE — Assessment & Plan Note (Signed)
Presenting today for HTN follow-up.  Amlodipine 5 mg daily was refilled at her last appointment.  She is additionally prescribed lisinopril-HCTZ 20-25 mg daily.  She is mildly hypotensive today with BP readings of 95/55 and 100/58. -Discontinue lisinopril-HCTZ -Start lisinopril 20 mg daily -Amlodipine 5 mg daily

## 2023-01-27 NOTE — Assessment & Plan Note (Signed)
Gastroenterology referral placed today for screening colonoscopy. 

## 2023-01-27 NOTE — Progress Notes (Signed)
Established Patient Office Visit  Subjective   Patient ID: Jennifer Armstrong, female    DOB: 1950-11-07  Age: 72 y.o. MRN: [id number]  Chief Complaint  Patient presents with   Hypertension   Results    Lab result review   Jennifer Armstrong returns to care today for HTN follow-up and lab review.  Last seen by me on 6/7 for her annual exam.  Her blood pressure was elevated at that time and she reported that she has been out of amlodipine.  Medications were refilled and labs were updated.  A 4-week follow-up was arranged.  There have been no acute interval events.  Jennifer Armstrong Blocker reports feeling well today.  She is asymptomatic and has no acute concerns to discuss.  Past Medical History:  Diagnosis Date   Hyperlipidemia    Hypertension    Knee pain, right    Tibial plateau fracture    Past Surgical History:  Procedure Laterality Date   ABDOMINAL HYSTERECTOMY     KNEE SURGERY     Social History   Tobacco Use   Smoking status: Former    Packs/day: .5    Types: Cigarettes    Start date: 07/25/1977    Quit date: 12/2021    Years since quitting: 1.0   Smokeless tobacco: Never   Tobacco comments:    Used to smoke almost a pack now cut back to 0.5pack a day , has been smoking for over 50 years.   Vaping Use   Vaping Use: Never used  Substance Use Topics   Alcohol use: Not Currently   Drug use: Never   Family History  Problem Relation Age of Onset   Cancer Mother    Heart attack Father    Sickle cell trait Son    Sickle cell trait Son    Sickle cell anemia Daughter    No Known Allergies  Review of Systems  Constitutional:  Negative for chills and fever.  HENT:  Negative for sore throat.   Respiratory:  Negative for cough and shortness of breath.   Cardiovascular:  Negative for chest pain, palpitations and leg swelling.  Gastrointestinal:  Negative for abdominal pain, blood in stool, constipation, diarrhea, nausea and vomiting.  Genitourinary:  Negative for dysuria and hematuria.   Musculoskeletal:  Negative for myalgias.  Skin:  Negative for itching and rash.  Neurological:  Negative for dizziness and headaches.  Psychiatric/Behavioral:  Negative for depression and suicidal ideas.      Objective:     BP (!) 95/55   Pulse (!) 59   Ht 5\' 3"  (1.6 m)   Wt 139 lb (63 kg)   SpO2 97%   BMI 24.62 kg/m  BP Readings from Last 3 Encounters:  01/27/23 (!) 95/55  12/30/22 (!) 149/73  09/29/22 131/72   Physical Exam Vitals reviewed.  Constitutional:      General: She is not in acute distress.    Appearance: Normal appearance. She is not toxic-appearing.  HENT:     Head: Normocephalic and atraumatic.     Right Ear: External ear normal.     Left Ear: External ear normal.     Nose: Nose normal. No congestion or rhinorrhea.     Mouth/Throat:     Mouth: Mucous membranes are moist.     Pharynx: Oropharynx is clear. No oropharyngeal exudate or posterior oropharyngeal erythema.  Eyes:     General: No scleral icterus.    Extraocular Movements: Extraocular movements intact.  Conjunctiva/sclera: Conjunctivae normal.     Pupils: Pupils are equal, round, and reactive to light.  Cardiovascular:     Rate and Rhythm: Normal rate and regular rhythm.     Pulses: Normal pulses.     Heart sounds: Normal heart sounds. No murmur heard.    No friction rub. No gallop.  Pulmonary:     Effort: Pulmonary effort is normal.     Breath sounds: Normal breath sounds. No wheezing, rhonchi or rales.  Abdominal:     General: Abdomen is flat. Bowel sounds are normal. There is no distension.     Palpations: Abdomen is soft.     Tenderness: There is no abdominal tenderness.  Musculoskeletal:        General: No swelling. Normal range of motion.     Cervical back: Normal range of motion.     Right lower leg: No edema.     Left lower leg: No edema.  Lymphadenopathy:     Cervical: No cervical adenopathy.  Skin:    General: Skin is warm and dry.     Capillary Refill: Capillary refill  takes less than 2 seconds.     Coloration: Skin is not jaundiced.  Neurological:     General: No focal deficit present.     Mental Status: She is alert and oriented to person, place, and time.  Psychiatric:        Mood and Affect: Mood normal.        Behavior: Behavior normal.   Last CBC Lab Results  Component Value Date   WBC 6.5 12/30/2022   HGB 12.1 12/30/2022   HCT 36.0 12/30/2022   MCV 93 12/30/2022   MCH 31.3 12/30/2022   RDW 12.8 12/30/2022   PLT 217 12/30/2022   Last metabolic panel Lab Results  Component Value Date   GLUCOSE 90 12/30/2022   NA 142 12/30/2022   K 4.2 12/30/2022   CL 106 12/30/2022   CO2 24 12/30/2022   BUN 13 12/30/2022   CREATININE 0.91 12/30/2022   EGFR 68 12/30/2022   CALCIUM 9.6 12/30/2022   PROT 6.8 12/30/2022   ALBUMIN 4.0 12/30/2022   LABGLOB 2.8 12/30/2022   AGRATIO 1.4 12/30/2022   BILITOT 0.3 12/30/2022   ALKPHOS 82 12/30/2022   AST 10 12/30/2022   ALT 10 12/30/2022   Last lipids Lab Results  Component Value Date   CHOL 165 12/30/2022   HDL 72 12/30/2022   LDLCALC 83 12/30/2022   TRIG 50 12/30/2022   CHOLHDL 2.3 12/30/2022   Last hemoglobin A1c Lab Results  Component Value Date   HGBA1C 5.9 (H) 12/30/2022   Last thyroid functions Lab Results  Component Value Date   TSH 3.660 12/30/2022   Last vitamin D Lab Results  Component Value Date   VD25OH 32.6 12/30/2022   Last vitamin B12 and Folate Lab Results  Component Value Date   VITAMINB12 233 12/30/2022   FOLATE 9.2 12/30/2022   The 10-year ASCVD risk score (Arnett DK, et al., 2019) is: 5.3%    Assessment & Plan:   Problem List Items Addressed This Visit       Essential hypertension    Presenting today for HTN follow-up.  Amlodipine 5 mg daily was refilled at her last appointment.  She is additionally prescribed lisinopril-HCTZ 20-25 mg daily.  She is mildly hypotensive today with BP readings of 95/55 and 100/58. -Discontinue lisinopril-HCTZ -Start  lisinopril 20 mg daily -Amlodipine 5 mg daily      Age-related  osteoporosis without current pathological fracture - Primary    T-score -2.6 on DEXA from 2022.  Recently updated vitamin D and calcium levels are low-normal.  She is not currently on vitamin D and calcium supplementation. -Vitamin D + calcium supplementation has been prescribed today. -Consider addition of bisphosphonate therapy at follow-up in 3 months      Screening for colon cancer    Gastroenterology referral placed today for screening colonoscopy.       Return in about 3 months (around 04/29/2023).    Billie Lade, MD

## 2023-01-27 NOTE — Patient Instructions (Signed)
It was a pleasure to see you today.  Thank you for giving [place] the opportunity to be involved in your care.  Below is a brief recap of your visit and next steps.  We will plan to see you again in 3 months.  Summary Add vitamin D and calcium supplementation today Discontinue lisinopril-hydrochlorothiazide combo pill and start lisinopril 20 mg daily GI referral placed for colonoscopy Follow up in 3 months

## 2023-01-30 ENCOUNTER — Encounter: Payer: Self-pay | Admitting: *Deleted

## 2023-05-02 ENCOUNTER — Encounter: Payer: Self-pay | Admitting: Internal Medicine

## 2023-05-02 ENCOUNTER — Ambulatory Visit (INDEPENDENT_AMBULATORY_CARE_PROVIDER_SITE_OTHER): Payer: 59 | Admitting: Internal Medicine

## 2023-05-02 VITALS — BP 123/64 | HR 58 | Ht 63.0 in | Wt 147.4 lb

## 2023-05-02 DIAGNOSIS — Z2821 Immunization not carried out because of patient refusal: Secondary | ICD-10-CM

## 2023-05-02 DIAGNOSIS — I10 Essential (primary) hypertension: Secondary | ICD-10-CM | POA: Diagnosis not present

## 2023-05-02 DIAGNOSIS — Z720 Tobacco use: Secondary | ICD-10-CM

## 2023-05-02 DIAGNOSIS — M810 Age-related osteoporosis without current pathological fracture: Secondary | ICD-10-CM | POA: Diagnosis not present

## 2023-05-02 DIAGNOSIS — Z1231 Encounter for screening mammogram for malignant neoplasm of breast: Secondary | ICD-10-CM

## 2023-05-02 DIAGNOSIS — E782 Mixed hyperlipidemia: Secondary | ICD-10-CM | POA: Diagnosis not present

## 2023-05-02 DIAGNOSIS — N182 Chronic kidney disease, stage 2 (mild): Secondary | ICD-10-CM

## 2023-05-02 DIAGNOSIS — Z1211 Encounter for screening for malignant neoplasm of colon: Secondary | ICD-10-CM

## 2023-05-02 MED ORDER — ATORVASTATIN CALCIUM 40 MG PO TABS: 40.0000 mg | ORAL_TABLET | Freq: Every day | ORAL | 3 refills | Status: DC

## 2023-05-02 MED ORDER — LISINOPRIL 5 MG PO TABS: 5.0000 mg | ORAL_TABLET | Freq: Every day | ORAL | 3 refills | Status: DC

## 2023-05-02 MED ORDER — ALENDRONATE SODIUM 70 MG PO TABS
70.0000 mg | ORAL_TABLET | ORAL | 11 refills | Status: DC
Start: 2023-05-02 — End: 2023-10-31

## 2023-05-02 NOTE — Assessment & Plan Note (Signed)
BP today is 123/64.  She is not taking any antihypertensive medications currently. -Through shared decision making, she will resume lisinopril 5 mg daily in the setting of CKD.  Discontinue amlodipine and lisinopril-HCTZ

## 2023-05-02 NOTE — Assessment & Plan Note (Signed)
Lipid panel updated in June.  Total cholesterol 165 and LDL 83.  She has most recently been prescribed atorvastatin 40 mg daily but has been out of medication recently. -Resume atorvastatin 40 mg daily

## 2023-05-02 NOTE — Assessment & Plan Note (Signed)
She currently smokes 0.5 packs/day and has been smoking since age 72.  She briefly quit in June 2023, but has resumed smoking.  She is precontemplative with regards to cessation at this time but is in agreement with undergoing lung cancer screening. -The patient was counseled on the dangers of tobacco use, and was advised to quit.  Reviewed strategies to maximize success, including removing cigarettes and smoking materials from environment, stress management, substitution of other forms of reinforcement, support of family/friends, and written materials. -Patient in agreement with lung cancer screening.  Our office will message the lung cancer screening program.

## 2023-05-02 NOTE — Patient Instructions (Signed)
It was a pleasure to see you today.  Thank you for giving [place] the opportunity to be involved in your care.  Below is a brief recap of your visit and next steps.  We will plan to see you again in 6 months.  Summary Start fosamax today for osteoporosis Repeat labs ordered Start lisinopril 5 mg daily for hypertension Discontinue amlodipine and lisinopril-hydrochlorothiazide Resume atorvastatin Colon, breast, and lung cancer screenings ordered Follow up in 6 months

## 2023-05-02 NOTE — Assessment & Plan Note (Addendum)
T-score -2.6 on DEXA from 2022.  Vitamin D plus calcium supplementation was prescribed at her last appointment.  She has been taking supplements as prescribed.  Vitamin D and calcium levels were low-normal when last checked. -Start Fosamax 70 mg weekly.  Proper techniques for taking Fosamax were reviewed. -Repeat vitamin D level and CMP ordered today

## 2023-05-02 NOTE — Assessment & Plan Note (Signed)
Screening mammogram ordered today ?

## 2023-05-02 NOTE — Progress Notes (Signed)
Established Patient Office Visit  Subjective   Patient ID: Jennifer Armstrong, female    DOB: 1950/11/07  Age: 72 y.o. MRN: [id number]  Chief Complaint  Patient presents with   Follow-up    3-mo   Jennifer Armstrong returns to care today for routine follow-up.  He was last evaluated by me on 7/5 for HTN follow-up.  Lisinopril-HCTZ was discontinued at that time as he was mildly hypotensive.  Lisinopril 20 mg daily was started.  Vitamin D and calcium supplementation were also prescribed in the setting of osteoporosis.  76-month follow-up was arranged.  There have been no acute interval events. Jennifer Armstrong reports feeling well today. She is asymptomatic and has no acute concerns to discuss.   Past Medical History:  Diagnosis Date   Hyperlipidemia    Hypertension    Knee pain, right    Tibial plateau fracture    Past Surgical History:  Procedure Laterality Date   ABDOMINAL HYSTERECTOMY     KNEE SURGERY     Social History   Tobacco Use   Smoking status: Former    Current packs/day: 0.00    Average packs/day: 0.5 packs/day for 44.4 years (22.2 ttl pk-yrs)    Types: Cigarettes    Start date: 07/25/1977    Quit date: 12/2021    Years since quitting: 1.3   Smokeless tobacco: Never   Tobacco comments:    Used to smoke almost a pack now cut back to 0.5pack a day , has been smoking for over 50 years.   Vaping Use   Vaping status: Never Used  Substance Use Topics   Alcohol use: Not Currently   Drug use: Never   Family History  Problem Relation Age of Onset   Cancer Mother    Heart attack Father    Sickle cell trait Son    Sickle cell trait Son    Sickle cell anemia Daughter    No Known Allergies  Review of Systems  Constitutional:  Negative for chills and fever.  HENT:  Negative for sore throat.   Respiratory:  Negative for cough and shortness of breath.   Cardiovascular:  Negative for chest pain, palpitations and leg swelling.  Gastrointestinal:  Negative for abdominal pain,  blood in stool, constipation, diarrhea, nausea and vomiting.  Genitourinary:  Negative for dysuria and hematuria.  Musculoskeletal:  Negative for myalgias.  Skin:  Negative for itching and rash.  Neurological:  Negative for dizziness and headaches.  Psychiatric/Behavioral:  Negative for depression and suicidal ideas.      Objective:     BP 123/64 (BP Location: Left Arm, Patient Position: Sitting, Cuff Size: Normal)   Pulse (!) 58   Ht 5\' 3"  (1.6 m)   Wt 147 lb 6.4 oz (66.9 kg)   SpO2 98%   BMI 26.11 kg/m  BP Readings from Last 3 Encounters:  05/02/23 123/64  01/27/23 (!) 95/55  12/30/22 (!) 149/73   Physical Exam Vitals reviewed.  Constitutional:      General: She is not in acute distress.    Appearance: Normal appearance. She is not toxic-appearing.  HENT:     Head: Normocephalic and atraumatic.     Right Ear: External ear normal.     Left Ear: External ear normal.     Nose: Nose normal. No congestion or rhinorrhea.     Mouth/Throat:     Mouth: Mucous membranes are moist.     Pharynx: Oropharynx is clear. No oropharyngeal exudate or posterior oropharyngeal erythema.  Eyes:     General: No scleral icterus.    Extraocular Movements: Extraocular movements intact.     Conjunctiva/sclera: Conjunctivae normal.     Pupils: Pupils are equal, round, and reactive to light.  Cardiovascular:     Rate and Rhythm: Normal rate and regular rhythm.     Pulses: Normal pulses.     Heart sounds: Normal heart sounds. No murmur heard.    No friction rub. No gallop.  Pulmonary:     Effort: Pulmonary effort is normal.     Breath sounds: Normal breath sounds. No wheezing, rhonchi or rales.  Abdominal:     General: Abdomen is flat. Bowel sounds are normal. There is no distension.     Palpations: Abdomen is soft.     Tenderness: There is no abdominal tenderness.  Musculoskeletal:        General: No swelling. Normal range of motion.     Cervical back: Normal range of motion.     Right  lower leg: No edema.     Left lower leg: No edema.  Lymphadenopathy:     Cervical: No cervical adenopathy.  Skin:    General: Skin is warm and dry.     Capillary Refill: Capillary refill takes less than 2 seconds.     Coloration: Skin is not jaundiced.  Neurological:     General: No focal deficit present.     Mental Status: She is alert and oriented to person, place, and time.  Psychiatric:        Mood and Affect: Mood normal.        Behavior: Behavior normal.   Last CBC Lab Results  Component Value Date   WBC 6.5 12/30/2022   HGB 12.1 12/30/2022   HCT 36.0 12/30/2022   MCV 93 12/30/2022   MCH 31.3 12/30/2022   RDW 12.8 12/30/2022   PLT 217 12/30/2022   Last metabolic panel Lab Results  Component Value Date   GLUCOSE 90 12/30/2022   NA 142 12/30/2022   K 4.2 12/30/2022   CL 106 12/30/2022   CO2 24 12/30/2022   BUN 13 12/30/2022   CREATININE 0.91 12/30/2022   EGFR 68 12/30/2022   CALCIUM 9.6 12/30/2022   PROT 6.8 12/30/2022   ALBUMIN 4.0 12/30/2022   LABGLOB 2.8 12/30/2022   AGRATIO 1.4 12/30/2022   BILITOT 0.3 12/30/2022   ALKPHOS 82 12/30/2022   AST 10 12/30/2022   ALT 10 12/30/2022   Last lipids Lab Results  Component Value Date   CHOL 165 12/30/2022   HDL 72 12/30/2022   LDLCALC 83 12/30/2022   TRIG 50 12/30/2022   CHOLHDL 2.3 12/30/2022   Last hemoglobin A1c Lab Results  Component Value Date   HGBA1C 5.9 (H) 12/30/2022   Last thyroid functions Lab Results  Component Value Date   TSH 3.660 12/30/2022   Last vitamin D Lab Results  Component Value Date   VD25OH 32.6 12/30/2022   Last vitamin B12 and Folate Lab Results  Component Value Date   VITAMINB12 233 12/30/2022   FOLATE 9.2 12/30/2022   The 10-year ASCVD risk score (Arnett DK, et al., 2019) is: 8.9%    Assessment & Plan:   Problem List Items Addressed This Visit       Essential hypertension - Primary    BP today is 123/64.  She is not taking any antihypertensive medications  currently. -Through shared decision making, she will resume lisinopril 5 mg daily in the setting of CKD.  Discontinue amlodipine  and lisinopril-HCTZ      Age-related osteoporosis without current pathological fracture    T-score -2.6 on DEXA from 2022.  Vitamin D plus calcium supplementation was prescribed at her last appointment.  She has been taking supplements as prescribed.  Vitamin D and calcium levels were low-normal when last checked. -Start Fosamax 70 mg weekly.  Proper techniques for taking Fosamax were reviewed. -Repeat vitamin D level and CMP ordered today      Tobacco use    She currently smokes 0.5 packs/day and has been smoking since age 49.  She briefly quit in June 2023, but has resumed smoking.  She is precontemplative with regards to cessation at this time but is in agreement with undergoing lung cancer screening. -The patient was counseled on the dangers of tobacco use, and was advised to quit.  Reviewed strategies to maximize success, including removing cigarettes and smoking materials from environment, stress management, substitution of other forms of reinforcement, support of family/friends, and written materials. -Patient in agreement with lung cancer screening.  Our office will message the lung cancer screening program.      Mixed hyperlipidemia    Lipid panel updated in June.  Total cholesterol 165 and LDL 83.  She has most recently been prescribed atorvastatin 40 mg daily but has been out of medication recently. -Resume atorvastatin 40 mg daily      Breast cancer screening by mammogram    Screening mammogram ordered today      Colon cancer screening    Cologuard ordered today      Return in about 6 months (around 10/31/2023).   Billie Lade, MD

## 2023-05-02 NOTE — Assessment & Plan Note (Signed)
Cologuard ordered today °

## 2023-05-03 LAB — CMP14+EGFR
ALT: 8 [IU]/L (ref 0–32)
AST: 11 [IU]/L (ref 0–40)
Albumin: 3.9 g/dL (ref 3.9–4.9)
Alkaline Phosphatase: 72 [IU]/L (ref 44–121)
BUN/Creatinine Ratio: 16 (ref 12–28)
BUN: 15 mg/dL (ref 8–27)
Bilirubin Total: 0.2 mg/dL (ref 0.0–1.2)
CO2: 22 mmol/L (ref 20–29)
Calcium: 9.4 mg/dL (ref 8.7–10.3)
Chloride: 106 mmol/L (ref 96–106)
Creatinine, Ser: 0.95 mg/dL (ref 0.57–1.00)
Globulin, Total: 2.9 g/dL (ref 1.5–4.5)
Glucose: 83 mg/dL (ref 70–99)
Potassium: 4.5 mmol/L (ref 3.5–5.2)
Sodium: 141 mmol/L (ref 134–144)
Total Protein: 6.8 g/dL (ref 6.0–8.5)
eGFR: 65 mL/min/{1.73_m2} (ref >59)

## 2023-05-03 LAB — VITAMIN D 25 HYDROXY (VIT D DEFICIENCY, FRACTURES): Vit D, 25-Hydroxy: 21.8 ng/mL — ABNORMAL LOW (ref 30.0–100.0)

## 2023-08-03 ENCOUNTER — Encounter: Payer: Self-pay | Admitting: *Deleted

## 2023-08-08 NOTE — Progress Notes (Signed)
 [facility] referral received. Attempted to reach patient regarding referral. Unable to reach patient directly. VM unavailable.

## 2023-08-18 NOTE — Progress Notes (Signed)
[facility] referral received. Called patient directly who would like to discuss further with PCP at her visit in April. She will ask them to re-refer at that time if she is interested. Referral closed at this time per patient request.

## 2023-08-29 ENCOUNTER — Ambulatory Visit (INDEPENDENT_AMBULATORY_CARE_PROVIDER_SITE_OTHER): Payer: 59

## 2023-08-29 VITALS — Ht 65.0 in | Wt 147.0 lb

## 2023-08-29 DIAGNOSIS — M8000XA Age-related osteoporosis with current pathological fracture, unspecified site, initial encounter for fracture: Secondary | ICD-10-CM

## 2023-08-29 DIAGNOSIS — Z0000 Encounter for general adult medical examination without abnormal findings: Secondary | ICD-10-CM | POA: Diagnosis not present

## 2023-08-29 DIAGNOSIS — Z1231 Encounter for screening mammogram for malignant neoplasm of breast: Secondary | ICD-10-CM

## 2023-08-29 DIAGNOSIS — Z1211 Encounter for screening for malignant neoplasm of colon: Secondary | ICD-10-CM

## 2023-08-29 DIAGNOSIS — F17210 Nicotine dependence, cigarettes, uncomplicated: Secondary | ICD-10-CM

## 2023-08-29 DIAGNOSIS — F17200 Nicotine dependence, unspecified, uncomplicated: Secondary | ICD-10-CM

## 2023-08-29 NOTE — Progress Notes (Signed)
 Subjective:   Jennifer Armstrong is a 73 y.o. female who presents for Medicare Annual (Subsequent) preventive examination.  Visit Complete: Virtual I connected with  Jennifer Armstrong on 08/29/23 by a audio enabled telemedicine application and verified that I am speaking with the correct person using two identifiers.  Patient Location: Home  Provider Location: Office/Clinic  I discussed the limitations of evaluation and management by telemedicine. The patient expressed understanding and agreed to proceed.  Vital Signs: Because this visit was a virtual/telehealth visit, some criteria may be missing or patient reported. Any vitals not documented were not able to be obtained and vitals that have been documented are patient reported.  Cardiac Risk Factors include: advanced age (>5men, >30 women);hypertension;dyslipidemia;smoking/ tobacco exposure (pt continues to smoke)    Objective:    Today's Vitals   08/29/23 0806  Weight: 147 lb (66.7 kg)  Height: 5' 5 (1.651 m)   Body mass index is 24.46 kg/m.     08/29/2023    9:03 AM 08/22/2022   10:18 AM 08/13/2021   12:29 PM 10/23/2017    6:38 PM  Advanced Directives  Does Patient Have a Medical Advance Directive? No No No No  Would patient like information on creating a medical advance directive?  Yes (ED - Information included in AVS) No - Patient declined No - Patient declined    Current Medications (verified) Outpatient Encounter Medications as of 08/29/2023  Medication Sig   alendronate  (FOSAMAX ) 70 MG tablet Take 1 tablet (70 mg total) by mouth every 7 (seven) days. Take with a full glass of water on an empty stomach.   atorvastatin  (LIPITOR) 40 MG tablet Take 1 tablet (40 mg total) by mouth daily.   calcium -vitamin D  (OSCAL WITH D) 500-5 MG-MCG tablet Take 1 tablet by mouth daily with breakfast.   lisinopril  (ZESTRIL ) 5 MG tablet Take 1 tablet (5 mg total) by mouth daily.   No facility-administered encounter medications on file as  of 08/29/2023.    Allergies (verified) Patient has no known allergies.   History: Past Medical History:  Diagnosis Date   Hyperlipidemia    Hypertension    Knee pain, right    Tibial plateau fracture    Past Surgical History:  Procedure Laterality Date   ABDOMINAL HYSTERECTOMY     KNEE SURGERY     Family History  Problem Relation Age of Onset   Cancer Mother    Heart attack Father    Sickle cell trait Son    Sickle cell trait Son    Sickle cell anemia Daughter    Social History   Socioeconomic History   Marital status: Single    Spouse name: Not on file   Number of children: 2   Years of education: Not on file   Highest education level: Not on file  Occupational History   Not on file  Tobacco Use   Smoking status: Every Day    Current packs/day: 1.00    Average packs/day: 0.5 packs/day for 44.5 years (22.3 ttl pk-yrs)    Types: Cigarettes    Start date: 07/25/1977    Last attempt to quit: 12/2021    Passive exposure: Current (pt continues to smoke)   Smokeless tobacco: Never   Tobacco comments:    Used to smoke almost a pack now cut back to 0.5pack a day , has been smoking for over 50 years.   Vaping Use   Vaping status: Never Used  Substance and Sexual Activity  Alcohol use: Not Currently   Drug use: Never   Sexual activity: Yes  Other Topics Concern   Not on file  Social History Narrative   Lives with her son .    Social Drivers of Corporate Investment Banker Strain: Low Risk  (08/29/2023)   Overall Financial Resource Strain (CARDIA)    Difficulty of Paying Living Expenses: Not hard at all  Food Insecurity: No Food Insecurity (08/29/2023)   Hunger Vital Sign    Worried About Running Out of Food in the Last Year: Never true    Ran Out of Food in the Last Year: Never true  Transportation Needs: No Transportation Needs (08/29/2023)   PRAPARE - Administrator, Civil Service (Medical): No    Lack of Transportation (Non-Medical): No  Physical  Activity: Sufficiently Active (08/29/2023)   Exercise Vital Sign    Days of Exercise per Week: 7 days    Minutes of Exercise per Session: 60 min  Stress: No Stress Concern Present (08/29/2023)   [employer] of Occupational Health - Occupational Stress Questionnaire    Feeling of Stress : Not at all  Social Connections: Moderately Isolated (08/29/2023)   Social Connection and Isolation Panel [NHANES]    Frequency of Communication with Friends and Family: More than three times a week    Frequency of Social Gatherings with Friends and Family: Once a week    Attends Religious Services: 1 to 4 times per year    Active Member of [employer] or Organizations: No    Attends Banker Meetings: Never    Marital Status: Never married    Tobacco Counseling Ready to quit: Not Answered Counseling given: Yes Tobacco comments: Used to smoke almost a pack now cut back to 0.5pack a day , has been smoking for over 50 years.    Clinical Intake:  Pre-visit preparation completed: Yes  Pain : No/denies pain     BMI - recorded: 24.46 Nutritional Status: BMI of 19-24  Normal Diabetes: No  How often do you need to have someone help you when you read instructions, pamphlets, or other written materials from your doctor or pharmacy?: 1 - Never  Interpreter Needed?: No  Information entered by :: Verdie Saba, CMA   Activities of Daily Living    08/29/2023    8:12 AM  In your present state of health, do you have any difficulty performing the following activities:  Hearing? 0  Vision? 0  Difficulty concentrating or making decisions? 0  Walking or climbing stairs? 0  Dressing or bathing? 0  Doing errands, shopping? 0  Preparing Food and eating ? N  Using the Toilet? N  In the past six months, have you accidently leaked urine? N  Do you have problems with loss of bowel control? N  Managing your Medications? N  Managing your Finances? N  Housekeeping or managing your Housekeeping? N     Patient Care Team: Melvenia Manus BRAVO, MD as PCP - General (Internal Medicine)  Indicate any recent Medical Services you may have received from other than [facility] providers in the past year (date may be approximate).     Assessment:   This is a routine wellness examination for [place].  Hearing/Vision screen Hearing Screening - Comments:: Denies hearing difficulties   Vision Screening - Comments:: Wears rx glasses - up to date with routine eye exams with  [facility]   Goals Addressed  This Visit's Progress     Patient Stated (pt-stated)        Patient stated to continue be active and exercise.       Depression Screen    08/29/2023    8:22 AM 05/02/2023    8:07 AM 01/27/2023    9:04 AM 12/30/2022    9:04 AM 09/29/2022    9:12 AM 08/22/2022   10:07 AM 08/08/2022    8:53 AM  PHQ 2/9 Scores  PHQ - 2 Score 0 0 0 0 0 0 0  PHQ- 9 Score  0 0 0 0  0    Fall Risk    08/29/2023    8:14 AM 05/02/2023    8:07 AM 01/27/2023    9:04 AM 12/30/2022    9:04 AM 09/29/2022    9:12 AM  Fall Risk   Falls in the past year? 0 0 0 0 0  Number falls in past yr: 0  0 0 0  Injury with Fall? 0  0 0 0  Risk for fall due to : No Fall Risks  No Fall Risks No Fall Risks No Fall Risks  Follow up Falls prevention discussed  Falls evaluation completed Falls evaluation completed Falls evaluation completed    MEDICARE RISK AT HOME: Medicare Risk at Home Any stairs in or around the home?: No If so, are there any without handrails?: No Home free of loose throw rugs in walkways, pet beds, electrical cords, etc?: Yes Adequate lighting in your home to reduce risk of falls?: Yes Life alert?: No Use of a cane, walker or w/c?: No Grab bars in the bathroom?: No Shower chair or bench in shower?: No Elevated toilet seat or a handicapped toilet?: No  TIMED UP AND GO:  Was the test performed?  No    Cognitive Function:        08/29/2023    8:15 AM  6CIT Screen  What Year? 0 points  What  month? 0 points  What time? 0 points  Count back from 20 0 points  Months in reverse 0 points  Repeat phrase 0 points  Total Score 0 points    Immunizations  There is no immunization history on file for this patient.  TDAP status: Due, Education has been provided regarding the importance of this vaccine. Advised may receive this vaccine at local pharmacy or Health Dept. Aware to provide a copy of the vaccination record if obtained from local pharmacy or Health Dept. Verbalized acceptance and understanding.  Flu Vaccine status: Declined, Education has been provided regarding the importance of this vaccine but patient still declined. Advised may receive this vaccine at local pharmacy or Health Dept. Aware to provide a copy of the vaccination record if obtained from local pharmacy or Health Dept. Verbalized acceptance and understanding.  Pneumococcal vaccine status: Declined,  Education has been provided regarding the importance of this vaccine but patient still declined. Advised may receive this vaccine at local pharmacy or Health Dept. Aware to provide a copy of the vaccination record if obtained from local pharmacy or Health Dept. Verbalized acceptance and understanding.   Covid-19 vaccine status: Declined, Education has been provided regarding the importance of this vaccine but patient still declined. Advised may receive this vaccine at local pharmacy or Health Dept.or vaccine clinic. Aware to provide a copy of the vaccination record if obtained from local pharmacy or Health Dept. Verbalized acceptance and understanding.  Qualifies for Shingles Vaccine? No   Zostavax completed No  Shingrix Completed?: No.    Education has been provided regarding the importance of this vaccine. Patient has been advised to call insurance company to determine out of pocket expense if they have not yet received this vaccine. Advised may also receive vaccine at local pharmacy or Health Dept. Verbalized acceptance  and understanding.  Screening Tests Health Maintenance  Topic Date Due   Pneumonia Vaccine 51+ Years old (1 of 2 - PCV) Never done   DTaP/Tdap/Td (1 - Tdap) Never done   Fecal DNA (Cologuard)  Never done   Lung Cancer Screening  Never done   Zoster Vaccines- Shingrix (1 of 2) Never done   MAMMOGRAM  03/16/2023   INFLUENZA VACCINE  10/23/2023 (Originally 02/23/2023)   Medicare Annual Wellness (AWV)  08/28/2024   DEXA SCAN  Completed   Hepatitis C Screening  Completed   HPV VACCINES  Aged Out   COVID-19 Vaccine  Discontinued    Health Maintenance  Health Maintenance Due  Topic Date Due   Pneumonia Vaccine 36+ Years old (1 of 2 - PCV) Never done   DTaP/Tdap/Td (1 - Tdap) Never done   Fecal DNA (Cologuard)  Never done   Lung Cancer Screening  Never done   Zoster Vaccines- Shingrix (1 of 2) Never done   MAMMOGRAM  03/16/2023   Colorectal cancer screening: Referral to GI placed Cologuard order placed per patient request.   Mammogram status: Ordered 08/29/23. Pt provided with contact info and advised to call to schedule appt.   Bone Density status: Ordered 08/29/23. Pt provided with contact info and advised to call to schedule appt.  Lung Cancer Screening: (Low Dose CT Chest recommended if Age 34-80 years, 20 pack-year currently smoking OR have quit w/in 15years.) does qualify.   Lung Cancer Screening Referral: 08/29/23 - current smoker  Additional Screening:  Hepatitis C Screening: does qualify; Completed 02/17/21  Vision Screening: Recommended annual ophthalmology exams for early detection of glaucoma and other disorders of the eye. Is the patient up to date with their annual eye exam?  Yes  Who is the provider or what is the name of the office in which the patient attends annual eye exams? [facility] If pt is not established with a provider, would they like to be referred to a provider to establish care? No .   Dental Screening: Recommended annual dental exams for proper  oral hygiene    Community Resource Referral / Chronic Care Management: CRR required this visit?  No   CCM required this visit?  No     Plan:     I have personally reviewed and noted the following in the patient's chart:   Medical and social history Use of alcohol, tobacco or illicit drugs  Current medications and supplements including opioid prescriptions. Patient is not currently taking opioid prescriptions. Functional ability and status Nutritional status Physical activity Advanced directives List of other physicians Hospitalizations, surgeries, and ER visits in previous 12 months Vitals Screenings to include cognitive, depression, and falls Referrals and appointments  In addition, I have reviewed and discussed with patient certain preventive protocols, quality metrics, and best practice recommendations. A written personalized care plan for preventive services as well as general preventive health recommendations were provided to patient.     Verdie CHRISTELLA Saba, CMA   08/29/2023   After Visit Summary: (Mail) Due to this being a telephonic visit, the after visit summary with patients personalized plan was offered to patient via mail   Nurse Notes: none

## 2023-08-29 NOTE — Patient Instructions (Addendum)
 Ms. Mohammad , Thank you for taking time to come for your Medicare Wellness Visit. I appreciate your ongoing commitment to your health goals. Please review the following plan we discussed and let me know if I can assist you in the future.   Referrals/Orders/Follow-Ups/Clinician Recommendations: Aim for 30 minutes of exercise or brisk walking, 6-8 glasses of water, and 5 servings of fruits and vegetables each day. Referral for Mammogram, Lung Cancer Screening, and a Cologuard kit.  This is a list of the screening recommended for you and due dates:  Health Maintenance  Topic Date Due   Pneumonia Vaccine (1 of 2 - PCV) Never done   DTaP/Tdap/Td vaccine (1 - Tdap) Never done   Cologuard (Stool DNA test)  Never done   Screening for Lung Cancer  Never done   Zoster (Shingles) Vaccine (1 of 2) Never done   Mammogram  03/16/2023   Flu Shot  10/23/2023*   Medicare Annual Wellness Visit  08/28/2024   DEXA scan (bone density measurement)  Completed   Hepatitis C Screening  Completed   HPV Vaccine  Aged Out   COVID-19 Vaccine  Discontinued  *Topic was postponed. The date shown is not the original due date.    Advanced directives: (Declined) Advance directive discussed with you today. Even though you declined this today, please call our office should you change your mind, and we can give you the proper paperwork for you to fill out.  Next Medicare Annual Wellness Visit scheduled for next year: Yes 09/04/24

## 2023-08-29 NOTE — Addendum Note (Signed)
Addended by: Darreld Mclean on: 08/29/2023 10:19 AM   Modules accepted: Orders, Level of Service

## 2023-09-18 ENCOUNTER — Ambulatory Visit: Payer: 59

## 2023-10-31 ENCOUNTER — Ambulatory Visit (INDEPENDENT_AMBULATORY_CARE_PROVIDER_SITE_OTHER): Payer: 59 | Admitting: Internal Medicine

## 2023-10-31 ENCOUNTER — Encounter: Payer: Self-pay | Admitting: Internal Medicine

## 2023-10-31 VITALS — BP 142/70 | HR 62 | Ht 63.0 in | Wt 150.6 lb

## 2023-10-31 DIAGNOSIS — Z720 Tobacco use: Secondary | ICD-10-CM | POA: Diagnosis not present

## 2023-10-31 DIAGNOSIS — I10 Essential (primary) hypertension: Secondary | ICD-10-CM | POA: Diagnosis not present

## 2023-10-31 DIAGNOSIS — E782 Mixed hyperlipidemia: Secondary | ICD-10-CM | POA: Diagnosis not present

## 2023-10-31 DIAGNOSIS — R7303 Prediabetes: Secondary | ICD-10-CM | POA: Insufficient documentation

## 2023-10-31 DIAGNOSIS — N182 Chronic kidney disease, stage 2 (mild): Secondary | ICD-10-CM | POA: Diagnosis not present

## 2023-10-31 DIAGNOSIS — M810 Age-related osteoporosis without current pathological fracture: Secondary | ICD-10-CM | POA: Diagnosis not present

## 2023-10-31 MED ORDER — OYSTER SHELL CALCIUM/D3 500-5 MG-MCG PO TABS: 1.0000 | ORAL_TABLET | Freq: Every day | ORAL | 1 refills | Status: AC

## 2023-10-31 MED ORDER — ATORVASTATIN CALCIUM 40 MG PO TABS
40.0000 mg | ORAL_TABLET | Freq: Every day | ORAL | 3 refills | Status: DC
Start: 2023-10-31 — End: 2024-04-22

## 2023-10-31 MED ORDER — LISINOPRIL 10 MG PO TABS
10.0000 mg | ORAL_TABLET | Freq: Every day | ORAL | 3 refills | Status: AC
Start: 2023-10-31 — End: ?

## 2023-10-31 MED ORDER — ALENDRONATE SODIUM 70 MG PO TABS: 70.0000 mg | ORAL_TABLET | ORAL | 11 refills | Status: AC

## 2023-10-31 NOTE — Assessment & Plan Note (Signed)
 Atorvastatin 40 mg daily was resumed at her last appointment.  Repeat lipid panel ordered today.

## 2023-10-31 NOTE — Progress Notes (Signed)
 Established Patient Office Visit  Subjective   Patient ID: Jennifer Armstrong, female    DOB: [birth year]  Age: 73 y.o. MRN: [id number]  Chief Complaint  Patient presents with   Care Management    6 month f/u   Jennifer Armstrong returns to care today for routine follow-up.  He was last evaluated by me in October 2024.  I recommended resuming lisinopril 5 mg daily in the setting of CKD and starting Fosamax 70 mg weekly in the setting of osteoporosis.  I also recommended resuming atorvastatin 40 mg daily as she stated she had been out of medication recently.  A referral was placed for lung cancer screening, mammogram ordered, and 57-month follow-up arranged.  There have been no acute interval events. Jennifer Armstrong reports feeling well today.  She is asymptomatic and has no acute concerns to discuss.  Past Medical History:  Diagnosis Date   Hyperlipidemia    Hypertension    Knee pain, right    Tibial plateau fracture    Past Surgical History:  Procedure Laterality Date   ABDOMINAL HYSTERECTOMY     KNEE SURGERY     Social History   Tobacco Use   Smoking status: Every Day    Current packs/day: 1.00    Average packs/day: 0.5 packs/day for 44.7 years (22.5 ttl pk-yrs)    Types: Cigarettes    Start date: 07/25/1977    Last attempt to quit: 12/2021    Passive exposure: Current (pt continues to smoke)   Smokeless tobacco: Never   Tobacco comments:    Used to smoke almost a pack now cut back to 0.5pack a day , has been smoking for over 50 years.   Vaping Use   Vaping status: Never Used  Substance Use Topics   Alcohol use: Not Currently   Drug use: Never   Family History  Problem Relation Age of Onset   Cancer Mother    Heart attack Father    Sickle cell trait Son    Sickle cell trait Son    Sickle cell anemia Daughter    No Known Allergies  Review of Systems  Constitutional:  Negative for chills and fever.  HENT:  Negative for sore throat.   Respiratory:  Negative for cough and  shortness of breath.   Cardiovascular:  Negative for chest pain, palpitations and leg swelling.  Gastrointestinal:  Negative for abdominal pain, blood in stool, constipation, diarrhea, nausea and vomiting.  Genitourinary:  Negative for dysuria and hematuria.  Musculoskeletal:  Negative for myalgias.  Skin:  Negative for itching and rash.  Neurological:  Negative for dizziness and headaches.  Psychiatric/Behavioral:  Negative for depression and suicidal ideas.      Objective:     BP (!) 142/70   Pulse 62   Ht 5\' 3"  (1.6 m)   Wt 150 lb 9.6 oz (68.3 kg)   SpO2 98%   BMI 26.68 kg/m  BP Readings from Last 3 Encounters:  10/31/23 (!) 142/70  05/02/23 123/64  01/27/23 (!) 95/55   Physical Exam Vitals reviewed.  Constitutional:      General: She is not in acute distress.    Appearance: Normal appearance. She is not toxic-appearing.  HENT:     Head: Normocephalic and atraumatic.     Right Ear: External ear normal.     Left Ear: External ear normal.     Nose: Nose normal. No congestion or rhinorrhea.     Mouth/Throat:     Mouth: Mucous membranes  are moist.     Pharynx: Oropharynx is clear. No oropharyngeal exudate or posterior oropharyngeal erythema.  Eyes:     General: No scleral icterus.    Extraocular Movements: Extraocular movements intact.     Conjunctiva/sclera: Conjunctivae normal.     Pupils: Pupils are equal, round, and reactive to light.  Cardiovascular:     Rate and Rhythm: Normal rate and regular rhythm.     Pulses: Normal pulses.     Heart sounds: Murmur heard.     No friction rub. No gallop.  Pulmonary:     Effort: Pulmonary effort is normal.     Breath sounds: Normal breath sounds. No wheezing, rhonchi or rales.  Abdominal:     General: Abdomen is flat. Bowel sounds are normal. There is no distension.     Palpations: Abdomen is soft.     Tenderness: There is no abdominal tenderness.  Musculoskeletal:        General: No swelling. Normal range of motion.      Cervical back: Normal range of motion.     Right lower leg: No edema.     Left lower leg: No edema.  Lymphadenopathy:     Cervical: No cervical adenopathy.  Skin:    General: Skin is warm and dry.     Capillary Refill: Capillary refill takes less than 2 seconds.     Coloration: Skin is not jaundiced.  Neurological:     General: No focal deficit present.     Mental Status: She is alert and oriented to person, place, and time.  Psychiatric:        Mood and Affect: Mood normal.        Behavior: Behavior normal.   Last CBC Lab Results  Component Value Date   WBC 6.5 12/30/2022   HGB 12.1 12/30/2022   HCT 36.0 12/30/2022   MCV 93 12/30/2022   MCH 31.3 12/30/2022   RDW 12.8 12/30/2022   PLT 217 12/30/2022   Last metabolic panel Lab Results  Component Value Date   GLUCOSE 83 05/02/2023   NA 141 05/02/2023   K 4.5 05/02/2023   CL 106 05/02/2023   CO2 22 05/02/2023   BUN 15 05/02/2023   CREATININE 0.95 05/02/2023   EGFR 65 05/02/2023   CALCIUM 9.4 05/02/2023   PROT 6.8 05/02/2023   ALBUMIN 3.9 05/02/2023   LABGLOB 2.9 05/02/2023   AGRATIO 1.4 12/30/2022   BILITOT 0.2 05/02/2023   ALKPHOS 72 05/02/2023   AST 11 05/02/2023   ALT 8 05/02/2023   Last lipids Lab Results  Component Value Date   CHOL 165 12/30/2022   HDL 72 12/30/2022   LDLCALC 83 12/30/2022   TRIG 50 12/30/2022   CHOLHDL 2.3 12/30/2022   Last hemoglobin A1c Lab Results  Component Value Date   HGBA1C 5.9 (H) 12/30/2022   Last thyroid functions Lab Results  Component Value Date   TSH 3.660 12/30/2022   Last vitamin D Lab Results  Component Value Date   VD25OH 21.8 (L) 05/02/2023   Last vitamin B12 and Folate Lab Results  Component Value Date   VITAMINB12 233 12/30/2022   FOLATE 9.2 12/30/2022     Assessment & Plan:   Problem List Items Addressed This Visit       Essential hypertension - Primary   Lisinopril 5 mg daily was resumed at her previous appointment in the setting of CKD.   Amlodipine and lisinopril-HCTZ discontinued.  BP remains mildly elevated today.  Increase lisinopril to  10 mg daily.      Age-related osteoporosis without current pathological fracture   T-score -2.6 on DEXA from 2022.  Fosamax 70 mg weekly was started at her last appointment.  She endorses compliance with Fosamax as well as vitamin D + calcium supplementation. -Repeat DEXA pending.  Will assist with scheduling today -Repeat labs ordered      CKD (chronic kidney disease), stage II   Repeat labs ordered today.  Currently prescribed lisinopril.      Tobacco use   She continues to smoke 0.5 packs/day of cigarettes and has been smoking since age 57.  She remains precontemplative with regards to cessation at this time.  Previously referred for lung cancer screening but stated that she wanted to further discuss with me today at follow-up.  We reviewed indications for lung cancer screening and I again recommended proceeding with screening given current tobacco use and smoking history.  She is in agreement with lung cancer screening.  New referral placed today.      Mixed hyperlipidemia   Atorvastatin 40 mg daily was resumed at her last appointment.  Repeat lipid panel ordered today.      Prediabetes   A1c 5.9 on labs from June 2024.  Repeat A1c ordered today.       Return in about 6 months (around 05/01/2024).    Billie Lade, MD

## 2023-10-31 NOTE — Assessment & Plan Note (Signed)
 Repeat labs ordered today.  Currently prescribed lisinopril.

## 2023-10-31 NOTE — Assessment & Plan Note (Signed)
 Lisinopril 5 mg daily was resumed at her previous appointment in the setting of CKD.  Amlodipine and lisinopril-HCTZ discontinued.  BP remains mildly elevated today.  Increase lisinopril to 10 mg daily.

## 2023-10-31 NOTE — Patient Instructions (Signed)
 It was a pleasure to see you today.  Thank you for giving [place] the opportunity to be involved in your care.  Below is a brief recap of your visit and next steps.  We will plan to see you again in 6 months.  Summary Increase lisinopril to 10 mg daily Repeat labs ordered New referral placed for lung cancer screening Follow up in 6 months

## 2023-10-31 NOTE — Assessment & Plan Note (Signed)
 T-score -2.6 on DEXA from 2022.  Fosamax 70 mg weekly was started at her last appointment.  She endorses compliance with Fosamax as well as vitamin D + calcium supplementation. -Repeat DEXA pending.  Will assist with scheduling today -Repeat labs ordered

## 2023-10-31 NOTE — Assessment & Plan Note (Signed)
 She continues to smoke 0.5 packs/day of cigarettes and has been smoking since age 73.  She remains precontemplative with regards to cessation at this time.  Previously referred for lung cancer screening but stated that she wanted to further discuss with me today at follow-up.  We reviewed indications for lung cancer screening and I again recommended proceeding with screening given current tobacco use and smoking history.  She is in agreement with lung cancer screening.  New referral placed today.

## 2023-10-31 NOTE — Assessment & Plan Note (Signed)
 A1c 5.9 on labs from June 2024.  Repeat A1c ordered today.

## 2023-11-01 LAB — CBC WITH DIFFERENTIAL/PLATELET
Basophils Absolute: 0 10*3/uL (ref 0.0–0.2)
Basos: 1 %
EOS (ABSOLUTE): 0.1 10*3/uL (ref 0.0–0.4)
Eos: 1 %
Hematocrit: 35.4 % (ref 34.0–46.6)
Hemoglobin: 12.1 g/dL (ref 11.1–15.9)
Immature Grans (Abs): 0 10*3/uL (ref 0.0–0.1)
Immature Granulocytes: 0 %
Lymphocytes Absolute: 1.9 10*3/uL (ref 0.7–3.1)
Lymphs: 33 %
MCH: 31.9 pg (ref 26.6–33.0)
MCHC: 34.2 g/dL (ref 31.5–35.7)
MCV: 93 fL (ref 79–97)
Monocytes Absolute: 0.5 10*3/uL (ref 0.1–0.9)
Monocytes: 8 %
Neutrophils Absolute: 3.3 10*3/uL (ref 1.4–7.0)
Neutrophils: 57 %
Platelets: 233 10*3/uL (ref 150–450)
RBC: 3.79 x10E6/uL (ref 3.77–5.28)
RDW: 12.6 % (ref 11.7–15.4)
WBC: 5.7 10*3/uL (ref 3.4–10.8)

## 2023-11-01 LAB — LIPID PANEL
Chol/HDL Ratio: 2.6 ratio (ref 0.0–4.4)
Cholesterol, Total: 159 mg/dL (ref 100–199)
HDL: 61 mg/dL (ref >39)
LDL Chol Calc (NIH): 84 mg/dL (ref 0–99)
Triglycerides: 75 mg/dL (ref 0–149)
VLDL Cholesterol Cal: 14 mg/dL (ref 5–40)

## 2023-11-01 LAB — CMP14+EGFR
ALT: 10 IU/L (ref 0–32)
AST: 12 IU/L (ref 0–40)
Albumin: 4.1 g/dL (ref 3.9–4.9)
Alkaline Phosphatase: 63 IU/L (ref 44–121)
BUN/Creatinine Ratio: 11 — ABNORMAL LOW (ref 12–28)
BUN: 10 mg/dL (ref 8–27)
Bilirubin Total: 0.3 mg/dL (ref 0.0–1.2)
CO2: 25 mmol/L (ref 20–29)
Calcium: 9.7 mg/dL (ref 8.7–10.3)
Chloride: 106 mmol/L (ref 96–106)
Creatinine, Ser: 0.95 mg/dL (ref 0.57–1.00)
Globulin, Total: 2.5 g/dL (ref 1.5–4.5)
Glucose: 63 mg/dL — ABNORMAL LOW (ref 70–99)
Potassium: 4.7 mmol/L (ref 3.5–5.2)
Sodium: 145 mmol/L — ABNORMAL HIGH (ref 134–144)
Total Protein: 6.6 g/dL (ref 6.0–8.5)
eGFR: 64 mL/min/{1.73_m2} (ref >59)

## 2023-11-01 LAB — HEMOGLOBIN A1C
Est. average glucose Bld gHb Est-mCnc: 117 mg/dL
Hgb A1c MFr Bld: 5.7 % — ABNORMAL HIGH (ref 4.8–5.6)

## 2023-11-01 LAB — TSH+FREE T4
Free T4: 1.1 ng/dL (ref 0.82–1.77)
TSH: 4.34 u[IU]/mL (ref 0.450–4.500)

## 2023-11-01 LAB — B12 AND FOLATE PANEL
Folate: 7.3 ng/mL (ref >3.0)
Vitamin B-12: 194 pg/mL — ABNORMAL LOW (ref 232–1245)

## 2023-11-01 LAB — VITAMIN D 25 HYDROXY (VIT D DEFICIENCY, FRACTURES): Vit D, 25-Hydroxy: 35.7 ng/mL (ref 30.0–100.0)

## 2023-11-14 ENCOUNTER — Telehealth: Payer: Self-pay | Admitting: *Deleted

## 2023-11-14 ENCOUNTER — Other Ambulatory Visit: Payer: Self-pay | Admitting: *Deleted

## 2023-11-14 DIAGNOSIS — Z87891 Personal history of nicotine dependence: Secondary | ICD-10-CM

## 2023-11-14 DIAGNOSIS — F17210 Nicotine dependence, cigarettes, uncomplicated: Secondary | ICD-10-CM

## 2023-11-14 DIAGNOSIS — Z122 Encounter for screening for malignant neoplasm of respiratory organs: Secondary | ICD-10-CM

## 2023-11-14 NOTE — Telephone Encounter (Signed)
 Lung Cancer Screening Narrative/Criteria Questionnaire (Cigarette Smokers Only- No Cigars/Pipes/vapes)   Jennifer Armstrong   SDMV:11/28/23 10:00 [name]                                           1951/06/01              LDCT: 12/05/23 8:30 AP    72 y.o.   Phone: [phone]  Lung Screening Narrative (confirm age 92-77 yrs Medicare / 50-80 yrs Private pay insurance)   Insurance information:[employer]   Referring Provider:D[facility]   This screening involves an initial phone call with a team member from our program. It is called a shared decision making visit. The initial meeting is required by insurance and Medicare to make sure you understand the program. This appointment takes about 15-20 minutes to complete. The CT scan will completed at a separate date/time. This scan takes about 5-10 minutes to complete and you may eat and drink before and after the scan.  Criteria questions for Lung Cancer Screening:   Are you a current or former smoker? Current Age began smoking: 27   If you are a former smoker, what year did you quit smoking? (within 15 yrs)   To calculate your smoking history, I need an accurate estimate of how many packs of cigarettes you smoked per day and for how many years. (Not just the number of PPD you are now smoking)   Years smoking 45 x Packs per day 1/2 - 1 = Pack years 37   (at least 20 pack yrs)   (Make sure they understand that we need to know how much they have smoked in the past, not just the number of PPD they are smoking now)  Do you have a personal history of cancer?  No    Do you have a family history of cancer? Yes  (cancer type and and relative) Mother (Colon)  Are you coughing up blood?  No  Have you had unexplained weight loss of 15 lbs or more in the last 6 months? No  It looks like you meet all criteria.     Additional information: N/A

## 2023-11-28 ENCOUNTER — Ambulatory Visit (INDEPENDENT_AMBULATORY_CARE_PROVIDER_SITE_OTHER): Admitting: Acute Care

## 2023-11-28 DIAGNOSIS — F17210 Nicotine dependence, cigarettes, uncomplicated: Secondary | ICD-10-CM | POA: Diagnosis not present

## 2023-11-28 NOTE — Patient Instructions (Signed)

## 2023-11-28 NOTE — Progress Notes (Addendum)
 Provider Attestation I agree with the documentation of the Shared Decision Making visit,  smoking cessation counseling if appropriate, and verification or eligibility for lung cancer screening as documented by the RN Nurse Navigator.   Lauraine PHEBE Lites, MSN, AGACNP-BC [facility] See Amion for personal pager PCCM on call pager [phone]     Virtual Visit via Telephone Note  I connected with Jennifer Armstrong on 11/28/23 at 10:00 AM EDT by telephone and verified that I am speaking with the correct person using two identifiers.  Location: Patient: Jennifer Armstrong Provider: Laneta Speaks, RN   I discussed the limitations, risks, security and privacy concerns of performing an evaluation and management service by telephone and the availability of in person appointments. I also discussed with the patient that there may be a patient responsible charge related to this service. The patient expressed understanding and agreed to proceed.   Shared Decision Making Visit Lung Cancer Screening Program [phone])   Eligibility: Age 73 y.o. Pack Years Smoking History Calculation 25 (# packs/per year x # years smoked) Recent History of coughing up blood  no Unexplained weight loss? no ( >Than 15 pounds within the last 6 months ) Prior History Lung / other cancer no (Diagnosis within the last 5 years already requiring surveillance chest CT Scans). Smoking Status Current Smoker Former Smokers: Years since quit: n/a  Quit Date: n/a  Visit Components: Discussion included one or more decision making aids. yes Discussion included risk/benefits of screening. yes Discussion included potential follow up diagnostic testing for abnormal scans. yes Discussion included meaning and risk of over diagnosis. yes Discussion included meaning and risk of False Positives. yes Discussion included meaning of total radiation exposure. yes  Counseling Included: Importance of  adherence to annual lung cancer LDCT screening. yes Impact of comorbidities on ability to participate in the program. yes Ability and willingness to under diagnostic treatment. yes  Smoking Cessation Counseling: Current Smokers:  Discussed importance of smoking cessation. yes Information about tobacco cessation classes and interventions provided to patient. yes Patient provided with ticket for LDCT Scan. no Symptomatic Patient. no  Counseling(Intermediate counseling: > three minutes) 99406 Diagnosis Code: Tobacco Use Z72.0 Asymptomatic Patient yes  Counseling (Intermediate counseling: > three minutes counseling) H9563 Former Smokers:  Discussed the importance of maintaining cigarette abstinence. yes Diagnosis Code: Personal History of Nicotine Dependence. S12.108 Information about tobacco cessation classes and interventions provided to patient. Yes Patient provided with ticket for LDCT Scan. no Written Order for Lung Cancer Screening with LDCT placed in Epic. Yes (CT Chest Lung Cancer Screening Low Dose W/O CM) [id number] Z12.2-Screening of respiratory organs Z87.891-Personal history of nicotine dependence   Laneta Speaks, RN

## 2023-11-30 ENCOUNTER — Encounter: Payer: Self-pay | Admitting: Acute Care

## 2023-12-05 ENCOUNTER — Ambulatory Visit (HOSPITAL_COMMUNITY)
Admission: RE | Admit: 2023-12-05 | Discharge: 2023-12-05 | Disposition: A | Discharge status: Home / Self Care | Source: Ambulatory Visit | Attending: Acute Care | Admitting: Acute Care

## 2023-12-05 DIAGNOSIS — Z122 Encounter for screening for malignant neoplasm of respiratory organs: Secondary | ICD-10-CM | POA: Diagnosis not present

## 2023-12-05 DIAGNOSIS — F17210 Nicotine dependence, cigarettes, uncomplicated: Secondary | ICD-10-CM | POA: Insufficient documentation

## 2023-12-05 DIAGNOSIS — Z87891 Personal history of nicotine dependence: Secondary | ICD-10-CM | POA: Insufficient documentation

## 2023-12-26 ENCOUNTER — Other Ambulatory Visit: Payer: Self-pay

## 2023-12-26 DIAGNOSIS — Z87891 Personal history of nicotine dependence: Secondary | ICD-10-CM

## 2023-12-26 DIAGNOSIS — F17210 Nicotine dependence, cigarettes, uncomplicated: Secondary | ICD-10-CM

## 2023-12-26 DIAGNOSIS — Z122 Encounter for screening for malignant neoplasm of respiratory organs: Secondary | ICD-10-CM

## 2024-04-22 ENCOUNTER — Other Ambulatory Visit: Payer: Self-pay | Admitting: Internal Medicine

## 2024-04-22 DIAGNOSIS — E782 Mixed hyperlipidemia: Secondary | ICD-10-CM

## 2024-05-07 ENCOUNTER — Ambulatory Visit

## 2024-05-07 VITALS — BP 132/74 | HR 78 | Ht 63.0 in | Wt 159.1 lb

## 2024-05-07 DIAGNOSIS — E782 Mixed hyperlipidemia: Secondary | ICD-10-CM | POA: Diagnosis not present

## 2024-05-07 DIAGNOSIS — Z1231 Encounter for screening mammogram for malignant neoplasm of breast: Secondary | ICD-10-CM

## 2024-05-07 DIAGNOSIS — I10 Essential (primary) hypertension: Secondary | ICD-10-CM

## 2024-05-07 DIAGNOSIS — E559 Vitamin D deficiency, unspecified: Secondary | ICD-10-CM | POA: Diagnosis not present

## 2024-05-07 DIAGNOSIS — R7303 Prediabetes: Secondary | ICD-10-CM | POA: Diagnosis not present

## 2024-05-07 DIAGNOSIS — I2510 Atherosclerotic heart disease of native coronary artery without angina pectoris: Secondary | ICD-10-CM

## 2024-05-07 DIAGNOSIS — E538 Deficiency of other specified B group vitamins: Secondary | ICD-10-CM

## 2024-05-07 DIAGNOSIS — I700 Atherosclerosis of aorta: Secondary | ICD-10-CM

## 2024-05-07 NOTE — Progress Notes (Signed)
 Established Patient Office Visit  Subjective   Patient ID: Jennifer Armstrong, female    DOB: 03/24/1951  Age: 73 y.o. MRN: [id number]  Chief Complaint  Patient presents with   Medical Management of Chronic Issues    Pt here for a follow up    HPI Discussed the use of AI scribe software for clinical note transcription with the patient, who gave verbal consent to proceed.  History of Present Illness   Jennifer Armstrong is a 73 year old female with emphysema and atherosclerosis who presents for a follow-up visit.  Respiratory symptoms - Emphysema present - No new respiratory symptoms  Cardiovascular symptoms - Atherosclerosis with plaque in coronary arteries and aorta - No new cardiac symptoms - No lower extremity edema      Patient Active Problem List   Diagnosis Date Noted   Prediabetes 10/31/2023   Breast cancer screening by mammogram 05/02/2023   Colon cancer screening 05/02/2023   Encounter for well adult exam with abnormal findings 12/30/2022   Pneumococcal vaccination declined 08/08/2022   Screening for colon cancer 08/08/2022   Encounter to establish care 08/27/2021   Vitamin D  deficiency 07/05/2021   Age-related osteoporosis without current pathological fracture 04/08/2021   Mixed hyperlipidemia 04/08/2021   CKD (chronic kidney disease), stage II 04/08/2021   Murmur, cardiac 03/08/2021   Essential hypertension 03/08/2021   Tobacco use 03/08/2021   Tibial plateau fracture 02/09/2011   S/p tibial fracture 02/09/2011      ROS    Objective:     BP 132/74 (BP Location: Left Arm, Patient Position: Sitting, Cuff Size: Normal)   Pulse 78   Ht 5' 3 (1.6 m)   Wt 159 lb 1.3 oz (72.2 kg)   SpO2 94%   BMI 28.18 kg/m  BP Readings from Last 3 Encounters:  05/07/24 132/74  10/31/23 (!) 142/70  05/02/23 123/64   Wt Readings from Last 3 Encounters:  05/07/24 159 lb 1.3 oz (72.2 kg)  10/31/23 150 lb 9.6 oz (68.3 kg)  08/29/23 147 lb (66.7 kg)       Physical Exam Vitals and nursing note reviewed.  Constitutional:      Appearance: Normal appearance.  HENT:     Head: Normocephalic.  Eyes:     Extraocular Movements: Extraocular movements intact.     Pupils: Pupils are equal, round, and reactive to light.  Cardiovascular:     Rate and Rhythm: Normal rate and regular rhythm.  Pulmonary:     Effort: Pulmonary effort is normal.     Breath sounds: Normal breath sounds.  Musculoskeletal:     Cervical back: Normal range of motion and neck supple.  Neurological:     Mental Status: She is alert and oriented to person, place, and time.  Psychiatric:        Mood and Affect: Mood normal.        Thought Content: Thought content normal.        Last CBC Lab Results  Component Value Date   WBC 5.7 10/31/2023   HGB 12.1 10/31/2023   HCT 35.4 10/31/2023   MCV 93 10/31/2023   MCH 31.9 10/31/2023   RDW 12.6 10/31/2023   PLT 233 10/31/2023   Last metabolic panel Lab Results  Component Value Date   GLUCOSE 91 05/07/2024   NA 142 05/07/2024   K 5.0 05/07/2024   CL 104 05/07/2024   CO2 25 05/07/2024   BUN 12 05/07/2024   CREATININE 0.95 05/07/2024   EGFR  64 05/07/2024   CALCIUM  9.9 05/07/2024   PROT 7.5 05/07/2024   ALBUMIN 4.2 05/07/2024   LABGLOB 3.3 05/07/2024   AGRATIO 1.4 12/30/2022   BILITOT 0.5 05/07/2024   ALKPHOS 74 05/07/2024   AST 14 05/07/2024   ALT 13 05/07/2024   Last lipids Lab Results  Component Value Date   CHOL 168 05/07/2024   HDL 57 05/07/2024   LDLCALC 95 05/07/2024   TRIG 85 05/07/2024   CHOLHDL 2.9 05/07/2024   Last hemoglobin A1c Lab Results  Component Value Date   HGBA1C 5.7 (H) 05/07/2024   Last thyroid  functions Lab Results  Component Value Date   TSH 4.340 10/31/2023   Last vitamin D  Lab Results  Component Value Date   VD25OH 30.0 05/07/2024   Last vitamin B12 and Folate Lab Results  Component Value Date   VITAMINB12 195 (L) 05/07/2024   FOLATE 9.8 05/07/2024       The 10-year ASCVD risk score (Arnett DK, et al., 2019) is: 20.6%    Assessment & Plan:   Problem List Items Addressed This Visit       Cardiovascular and Mediastinum   Essential hypertension - Primary   Relevant Orders   CMP14+EGFR (Completed)     Other   Mixed hyperlipidemia   Relevant Orders   CMP14+EGFR (Completed)   Lipid Profile (Completed)   Vitamin D  deficiency   Relevant Orders   Vitamin D  (25 hydroxy) (Completed)   Prediabetes   Relevant Orders   CMP14+EGFR (Completed)   HgB A1c (Completed)   Other Visit Diagnoses       B12 deficiency       Relevant Orders   B12 and Folate Panel (Completed)     Encounter for screening mammogram for malignant neoplasm of breast       Relevant Orders   MM Digital Screening     Aortic atherosclerosis       Relevant Orders   Ambulatory referral to Cardiology     Atherosclerosis of coronary artery of native heart without angina pectoris, unspecified vessel or lesion type       Relevant Orders   Ambulatory referral to Cardiology      Return in about 6 months (around 11/05/2024) for chronic follow-up with PCP.    Leita Longs, FNP

## 2024-05-08 LAB — CMP14+EGFR
ALT: 13 IU/L (ref 0–32)
AST: 14 IU/L (ref 0–40)
Albumin: 4.2 g/dL (ref 3.9–4.9)
Alkaline Phosphatase: 74 IU/L (ref 49–135)
BUN/Creatinine Ratio: 13 (ref 12–28)
BUN: 12 mg/dL (ref 8–27)
Bilirubin Total: 0.5 mg/dL (ref 0.0–1.2)
CO2: 25 mmol/L (ref 20–29)
Calcium: 9.9 mg/dL (ref 8.7–10.3)
Chloride: 104 mmol/L (ref 96–106)
Creatinine, Ser: 0.95 mg/dL (ref 0.57–1.00)
Globulin, Total: 3.3 g/dL (ref 1.5–4.5)
Glucose: 91 mg/dL (ref 70–99)
Potassium: 5 mmol/L (ref 3.5–5.2)
Sodium: 142 mmol/L (ref 134–144)
Total Protein: 7.5 g/dL (ref 6.0–8.5)
eGFR: 64 mL/min/1.73 (ref >59)

## 2024-05-08 LAB — VITAMIN D 25 HYDROXY (VIT D DEFICIENCY, FRACTURES): Vit D, 25-Hydroxy: 30 ng/mL (ref 30.0–100.0)

## 2024-05-08 LAB — B12 AND FOLATE PANEL
Folate: 9.8 ng/mL (ref >3.0)
Vitamin B-12: 195 pg/mL — ABNORMAL LOW (ref 232–1245)

## 2024-05-08 LAB — LIPID PANEL
Chol/HDL Ratio: 2.9 ratio (ref 0.0–4.4)
Cholesterol, Total: 168 mg/dL (ref 100–199)
HDL: 57 mg/dL (ref >39)
LDL Chol Calc (NIH): 95 mg/dL (ref 0–99)
Triglycerides: 85 mg/dL (ref 0–149)
VLDL Cholesterol Cal: 16 mg/dL (ref 5–40)

## 2024-05-08 LAB — HEMOGLOBIN A1C
Est. average glucose Bld gHb Est-mCnc: 117 mg/dL
Hgb A1c MFr Bld: 5.7 % — ABNORMAL HIGH (ref 4.8–5.6)

## 2024-05-11 ENCOUNTER — Other Ambulatory Visit: Payer: Self-pay

## 2024-05-11 ENCOUNTER — Ambulatory Visit: Payer: Self-pay

## 2024-05-11 DIAGNOSIS — E538 Deficiency of other specified B group vitamins: Secondary | ICD-10-CM

## 2024-05-11 DIAGNOSIS — I700 Atherosclerosis of aorta: Secondary | ICD-10-CM | POA: Insufficient documentation

## 2024-05-11 DIAGNOSIS — I2510 Atherosclerotic heart disease of native coronary artery without angina pectoris: Secondary | ICD-10-CM | POA: Insufficient documentation

## 2024-05-11 MED ORDER — CYANOCOBALAMIN 1000 MCG/ML IJ SOLN
1000.0000 ug | INTRAMUSCULAR | Status: AC
  Administered 2024-05-15: 1000 ug via INTRAMUSCULAR

## 2024-05-11 NOTE — Assessment & Plan Note (Signed)
 Atherosclerosis of aorta and coronary arteries Recent CT showed plaque in coronary arteries and aorta, indicating atherosclerosis. - Refer to cardiology for further evaluation.

## 2024-05-11 NOTE — Assessment & Plan Note (Signed)
 Blood pressure improved to 132/74 mmHg upon recheck. Continue with lisinopril  10 mg and recheck CMP today.   Recommend follow-up in 3 months or sooner if BP remains aboe 140/90.

## 2024-05-15 ENCOUNTER — Ambulatory Visit (INDEPENDENT_AMBULATORY_CARE_PROVIDER_SITE_OTHER)

## 2024-05-15 DIAGNOSIS — E538 Deficiency of other specified B group vitamins: Secondary | ICD-10-CM

## 2024-05-15 NOTE — Progress Notes (Signed)
 Patient is in office today for a nurse visit for B12 Injection. Patient Injection was given in the  Right deltoid. Patient tolerated injection well.

## 2024-06-17 ENCOUNTER — Ambulatory Visit

## 2024-07-08 ENCOUNTER — Ambulatory Visit: Payer: Self-pay

## 2024-08-13 ENCOUNTER — Ambulatory Visit

## 2024-08-16 ENCOUNTER — Ambulatory Visit: Admitting: Internal Medicine

## 2024-08-21 ENCOUNTER — Ambulatory Visit: Admitting: Internal Medicine

## 2024-08-21 NOTE — Progress Notes (Signed)
 Erroneous encounter - please disregard.

## 2024-09-04 ENCOUNTER — Ambulatory Visit: Payer: 59

## 2024-09-10 ENCOUNTER — Ambulatory Visit

## 2024-11-05 ENCOUNTER — Ambulatory Visit
